# Patient Record
Sex: Female | Born: 1975
Health system: Southern US, Community
[De-identification: ages and names within clinical notes are randomized; demographics above are authoritative.]

## PROBLEM LIST (undated history)

## (undated) DIAGNOSIS — E785 Hyperlipidemia, unspecified: Secondary | ICD-10-CM

## (undated) DIAGNOSIS — N939 Abnormal uterine and vaginal bleeding, unspecified: Secondary | ICD-10-CM

## (undated) DIAGNOSIS — F419 Anxiety disorder, unspecified: Secondary | ICD-10-CM

## (undated) DIAGNOSIS — F329 Major depressive disorder, single episode, unspecified: Secondary | ICD-10-CM

## (undated) DIAGNOSIS — N83209 Unspecified ovarian cyst, unspecified side: Secondary | ICD-10-CM

## (undated) DIAGNOSIS — N946 Dysmenorrhea, unspecified: Secondary | ICD-10-CM

## (undated) DIAGNOSIS — N920 Excessive and frequent menstruation with regular cycle: Secondary | ICD-10-CM

## (undated) DIAGNOSIS — E559 Vitamin D deficiency, unspecified: Secondary | ICD-10-CM

## (undated) DIAGNOSIS — F32A Depression, unspecified: Secondary | ICD-10-CM

## (undated) DIAGNOSIS — K219 Gastro-esophageal reflux disease without esophagitis: Secondary | ICD-10-CM

## (undated) HISTORY — DX: Dysmenorrhea, unspecified: N94.6

## (undated) HISTORY — DX: Gastro-esophageal reflux disease without esophagitis: K21.9

## (undated) HISTORY — DX: Anxiety disorder, unspecified: F41.9

## (undated) HISTORY — DX: Hyperlipidemia, unspecified: E78.5

## (undated) HISTORY — DX: Unspecified ovarian cyst, unspecified side: N83.209

## (undated) HISTORY — PX: ESOPHAGOGASTRODUODENOSCOPY: SHX1529

## (undated) HISTORY — DX: Abnormal uterine and vaginal bleeding, unspecified: N93.9

## (undated) HISTORY — PX: TUBAL LIGATION: SHX77

## (undated) HISTORY — DX: Major depressive disorder, single episode, unspecified: F32.9

## (undated) HISTORY — DX: Vitamin D deficiency, unspecified: E55.9

## (undated) HISTORY — DX: Excessive and frequent menstruation with regular cycle: N92.0

## (undated) HISTORY — PX: ROTATOR CUFF REPAIR: SHX139

## (undated) HISTORY — DX: Depression, unspecified: F32.A

## (undated) HISTORY — PX: SHOULDER ARTHROSCOPY: SHX128

---

## 2006-04-16 ENCOUNTER — Ambulatory Visit: Payer: Self-pay

## 2006-06-18 ENCOUNTER — Ambulatory Visit: Payer: Self-pay | Admitting: Internal Medicine

## 2010-10-06 ENCOUNTER — Ambulatory Visit: Payer: Self-pay | Admitting: Family Medicine

## 2016-05-24 ENCOUNTER — Other Ambulatory Visit: Payer: Self-pay | Admitting: Obstetrics and Gynecology

## 2016-05-24 DIAGNOSIS — Z1231 Encounter for screening mammogram for malignant neoplasm of breast: Secondary | ICD-10-CM

## 2016-06-13 ENCOUNTER — Other Ambulatory Visit: Payer: Self-pay | Admitting: Obstetrics and Gynecology

## 2016-06-13 ENCOUNTER — Ambulatory Visit
Admission: RE | Admit: 2016-06-13 | Discharge: 2016-06-13 | Disposition: A | Discharge status: Home / Self Care | Payer: BLUE CROSS/BLUE SHIELD | Source: Ambulatory Visit | Attending: Obstetrics and Gynecology | Admitting: Obstetrics and Gynecology

## 2016-06-13 DIAGNOSIS — Z1231 Encounter for screening mammogram for malignant neoplasm of breast: Secondary | ICD-10-CM | POA: Diagnosis not present

## 2016-11-26 HISTORY — PX: COLONOSCOPY: SHX174

## 2017-05-22 ENCOUNTER — Ambulatory Visit (INDEPENDENT_AMBULATORY_CARE_PROVIDER_SITE_OTHER): Payer: BLUE CROSS/BLUE SHIELD | Admitting: Obstetrics and Gynecology

## 2017-05-22 ENCOUNTER — Encounter: Payer: Self-pay | Admitting: Obstetrics and Gynecology

## 2017-05-22 VITALS — BP 118/76 | Ht 64.0 in | Wt 174.0 lb

## 2017-05-22 DIAGNOSIS — Z01419 Encounter for gynecological examination (general) (routine) without abnormal findings: Secondary | ICD-10-CM | POA: Diagnosis not present

## 2017-05-22 DIAGNOSIS — Z1231 Encounter for screening mammogram for malignant neoplasm of breast: Secondary | ICD-10-CM

## 2017-05-22 DIAGNOSIS — N914 Secondary oligomenorrhea: Secondary | ICD-10-CM

## 2017-05-22 DIAGNOSIS — R634 Abnormal weight loss: Secondary | ICD-10-CM | POA: Diagnosis not present

## 2017-05-22 DIAGNOSIS — E559 Vitamin D deficiency, unspecified: Secondary | ICD-10-CM

## 2017-05-22 DIAGNOSIS — Z1239 Encounter for other screening for malignant neoplasm of breast: Secondary | ICD-10-CM

## 2017-05-22 NOTE — Progress Notes (Addendum)
Chief Complaint  Patient presents with  . Annual Exam     HPI:      [name] is a 41 y.o. [id number] who LMP was Patient's last menstrual period was 02/19/2017., presents today for her annual examination.  Her menses are usually every 28-30 days, lasting 3 days.  Dysmenorrhea mild, occurring premenstrually. She does not have intermenstrual bleeding. She has not had a period since 3/18 and this is unusual for her. She has lost 12# since last yr's annual, but closer to 25# per pt report (had gained wt since last seen by [place]). She states she doesn't have an appetite and has to force herself to eat. She denies any n/v/d/c sx. No pelvic pain.   Sex activity: single partner, contraception - tubal ligation.  Last Pap: January 31, 2015  Results were: no abnormalities /neg HPV DNA  Hx of STDs: none  Last mammogram: June 13, 2016  Results were: normal--routine follow-up in 12 months There is a FH of breast cancer in her MGM, genetic testing not indicated. There is no FH of ovarian cancer. The patient does not do self-breast exams.  Tobacco use: 2-3 cigs daily Alcohol use: none Exercise: moderately active  She does get adequate calcium and Vitamin D in her diet.  She had borderline lipids last yr and Vit D deficiency. She was taking Vit D supp but has since stopped them.   Pt stopped the prozac for anxiety and depression and feels fine without it. She has occas anxiety but manages it without meds.  Past Medical History:  Diagnosis Date  . Abnormal uterine bleeding   . Anxiety   . Depression   . Dysmenorrhea   . GERD (gastroesophageal reflux disease)   . Hyperlipidemia   . Menorrhagia   . Ovarian cyst   . Vitamin D deficiency     Past Surgical History:  Procedure Laterality Date  . COLONOSCOPY    . ESOPHAGOGASTRODUODENOSCOPY    . ROTATOR CUFF REPAIR    . SHOULDER ARTHROSCOPY    . TUBAL LIGATION      Family History  Problem Relation Age of Onset  . Breast cancer  Maternal Grandmother 50  . Diabetes Mother   . Hypertension Mother   . Diabetes Father     Social History   Social History  . Marital status: Single    Spouse name: N/A  . Number of children: N/A  . Years of education: N/A   Occupational History  . Not on file.   Social History Main Topics  . Smoking status: Current Some Day Smoker  . Smokeless tobacco: Never Used  . Alcohol use No  . Drug use: No  . Sexual activity: Yes    Birth control/ protection: Surgical   Other Topics Concern  . Not on file   Social History Narrative  . No narrative on file     Current Outpatient Prescriptions:  .  esomeprazole (NEXIUM) 40 MG capsule, Take 40 mg by mouth daily at 12 noon., Disp: , Rfl:   ROS:  Review of Systems  Constitutional: Positive for appetite change and unexpected weight change. Negative for fatigue and fever.  Respiratory: Negative for cough, shortness of breath and wheezing.   Cardiovascular: Negative for chest pain, palpitations and leg swelling.  Gastrointestinal: Negative for blood in stool, constipation, diarrhea, nausea and vomiting.  Endocrine: Negative for cold intolerance, heat intolerance and polyuria.  Genitourinary: Negative for dyspareunia, dysuria, flank pain, frequency, genital sores, hematuria, menstrual  problem, pelvic pain, urgency, vaginal bleeding, vaginal discharge and vaginal pain.  Musculoskeletal: Negative for back pain, joint swelling and myalgias.  Skin: Positive for rash.  Neurological: Negative for dizziness, syncope, light-headedness, numbness and headaches.  Hematological: Negative for adenopathy.  Psychiatric/Behavioral: Negative for agitation, confusion, sleep disturbance and suicidal ideas. The patient is nervous/anxious.      Objective: BP 118/76   Ht 5\' 4"  (1.626 m)   Wt 174 lb (78.9 kg)   LMP 02/19/2017   BMI 29.87 kg/m    Physical Exam  Constitutional: She is oriented to person, place, and time. She appears  well-developed and well-nourished.  Genitourinary: Vagina normal and uterus normal. There is no rash or tenderness on the right labia. There is no rash or tenderness on the left labia. No erythema or tenderness in the vagina. No vaginal discharge found. Right adnexum does not display mass and does not display tenderness. Left adnexum does not display mass and does not display tenderness. Cervix does not exhibit motion tenderness or polyp. Uterus is not enlarged or tender.  Neck: Normal range of motion. No thyromegaly present.  Cardiovascular: Normal rate, regular rhythm and normal heart sounds.   No murmur heard. Pulmonary/Chest: Effort normal and breath sounds normal. Right breast exhibits no mass, no nipple discharge, no skin change and no tenderness. Left breast exhibits no mass, no nipple discharge, no skin change and no tenderness.  Abdominal: Soft. There is no tenderness. There is no guarding.  Musculoskeletal: Normal range of motion.  Neurological: She is alert and oriented to person, place, and time. No cranial nerve deficit.  Psychiatric: She has a normal mood and affect. Her behavior is normal.  Vitals reviewed.    Assessment/Plan: Encounter for annual routine gynecological examination  Screening for breast cancer - Pt to sched mammo. - Plan: MM DIGITAL SCREENING BILATERAL  Secondary oligomenorrhea - Check labs. Will f/u with results. May be related to wt loss. - Plan: Comprehensive metabolic panel, TSH + free T4, Prolactin, Follicle stimulating hormone  Weight loss, unintentional - Check labs. If neg, refer to GI. - Plan: Comprehensive metabolic panel, TSH + free T4, CBC with Differential  Vitamin D deficiency - Restart Vit D3 5000 IU daily.             GYN counsel mammography screening, adequate intake of calcium and vitamin D, diet and exercise     F/U  Return in about 1 year (around 05/22/2018).  Beth B. Copland, PA-C 05/22/2017 9:38 AM

## 2017-05-23 ENCOUNTER — Telehealth: Payer: Self-pay | Admitting: Obstetrics and Gynecology

## 2017-05-23 DIAGNOSIS — R634 Abnormal weight loss: Secondary | ICD-10-CM

## 2017-05-23 LAB — COMPREHENSIVE METABOLIC PANEL
ALT: 27 IU/L (ref 0–32)
AST: 27 IU/L (ref 0–40)
Albumin/Globulin Ratio: 1.5 (ref 1.2–2.2)
Albumin: 4.3 g/dL (ref 3.5–5.5)
Alkaline Phosphatase: 74 IU/L (ref 39–117)
BUN/Creatinine Ratio: 11 (ref 9–23)
BUN: 8 mg/dL (ref 6–24)
Bilirubin Total: 0.4 mg/dL (ref 0.0–1.2)
CO2: 20 mmol/L (ref 20–29)
CREATININE: 0.7 mg/dL (ref 0.57–1.00)
Calcium: 9.4 mg/dL (ref 8.7–10.2)
Chloride: 104 mmol/L (ref 96–106)
GFR calc Af Amer: 124 mL/min/{1.73_m2} (ref >59)
GFR calc non Af Amer: 107 mL/min/{1.73_m2} (ref >59)
GLOBULIN, TOTAL: 2.9 g/dL (ref 1.5–4.5)
Glucose: 84 mg/dL (ref 65–99)
Potassium: 4.6 mmol/L (ref 3.5–5.2)
SODIUM: 140 mmol/L (ref 134–144)
Total Protein: 7.2 g/dL (ref 6.0–8.5)

## 2017-05-23 LAB — TSH+FREE T4
FREE T4: 1.16 ng/dL (ref 0.82–1.77)
TSH: 2.42 u[IU]/mL (ref 0.450–4.500)

## 2017-05-23 LAB — CBC WITH DIFFERENTIAL/PLATELET
BASOS: 1 %
Basophils Absolute: 0.1 10*3/uL (ref 0.0–0.2)
EOS (ABSOLUTE): 0.2 10*3/uL (ref 0.0–0.4)
EOS: 2 %
HEMATOCRIT: 42.7 % (ref 34.0–46.6)
Hemoglobin: 14.7 g/dL (ref 11.1–15.9)
Immature Grans (Abs): 0 10*3/uL (ref 0.0–0.1)
Immature Granulocytes: 0 %
LYMPHS ABS: 2.3 10*3/uL (ref 0.7–3.1)
Lymphs: 35 %
MCH: 29.2 pg (ref 26.6–33.0)
MCHC: 34.4 g/dL (ref 31.5–35.7)
MCV: 85 fL (ref 79–97)
MONOS ABS: 0.5 10*3/uL (ref 0.1–0.9)
Monocytes: 8 %
Neutrophils Absolute: 3.4 10*3/uL (ref 1.4–7.0)
Neutrophils: 54 %
Platelets: 389 10*3/uL — ABNORMAL HIGH (ref 150–379)
RBC: 5.04 x10E6/uL (ref 3.77–5.28)
RDW: 13.8 % (ref 12.3–15.4)
WBC: 6.4 10*3/uL (ref 3.4–10.8)

## 2017-05-23 LAB — FOLLICLE STIMULATING HORMONE: FSH: 8.7 m[IU]/mL

## 2017-05-23 LAB — PROLACTIN: Prolactin: 8.4 ng/mL (ref 4.8–23.3)

## 2017-05-23 MED ORDER — MEDROXYPROGESTERONE ACETATE 10 MG PO TABS: ORAL_TABLET | ORAL | 0 refills | Status: DC

## 2017-05-23 NOTE — Telephone Encounter (Signed)
Pt aware of neg labs for oligomenorrhea. Probably related to wt loss. Will try Rx provera. Pt is s/p TL and had neg UPT. Rx eRxd. F/u if no withdrawal bleed or if oligomenorrhea persists after withdrawal bleed.   Refer to GI due to anorexia sx and wt loss.

## 2017-06-19 ENCOUNTER — Ambulatory Visit
Admission: RE | Admit: 2017-06-19 | Discharge: 2017-06-19 | Disposition: A | Discharge status: Home / Self Care | Payer: BLUE CROSS/BLUE SHIELD | Source: Ambulatory Visit | Attending: Obstetrics and Gynecology | Admitting: Obstetrics and Gynecology

## 2017-06-19 ENCOUNTER — Encounter: Payer: Self-pay | Admitting: Obstetrics and Gynecology

## 2017-06-19 DIAGNOSIS — Z1239 Encounter for other screening for malignant neoplasm of breast: Secondary | ICD-10-CM

## 2017-06-19 DIAGNOSIS — Z1231 Encounter for screening mammogram for malignant neoplasm of breast: Secondary | ICD-10-CM | POA: Diagnosis present

## 2017-07-22 ENCOUNTER — Ambulatory Visit: Payer: BLUE CROSS/BLUE SHIELD | Admitting: Gastroenterology

## 2017-09-16 ENCOUNTER — Encounter: Payer: Self-pay | Admitting: Obstetrics and Gynecology

## 2017-09-16 NOTE — Telephone Encounter (Signed)
Can you pls change this ref? Pt seeing GI for unintentional wt loss. Thx.

## 2017-09-18 ENCOUNTER — Other Ambulatory Visit: Payer: Self-pay | Admitting: Obstetrics and Gynecology

## 2017-09-18 ENCOUNTER — Encounter: Payer: Self-pay | Admitting: Obstetrics and Gynecology

## 2017-09-18 MED ORDER — TERCONAZOLE 0.4 % VA CREA: 1.0000 | TOPICAL_CREAM | Freq: Every day | VAGINAL | 0 refills | Status: DC

## 2017-09-18 NOTE — Progress Notes (Signed)
Rx terazol for yeast vag sx, failed monistat. RTO prn sx persist.

## 2017-09-24 ENCOUNTER — Ambulatory Visit: Payer: BLUE CROSS/BLUE SHIELD | Admitting: Gastroenterology

## 2017-10-10 ENCOUNTER — Encounter: Payer: Self-pay | Admitting: Obstetrics and Gynecology

## 2017-10-16 ENCOUNTER — Encounter: Payer: Self-pay | Admitting: Obstetrics and Gynecology

## 2017-11-26 DIAGNOSIS — E28319 Asymptomatic premature menopause: Secondary | ICD-10-CM

## 2017-11-26 HISTORY — DX: Asymptomatic premature menopause: E28.319

## 2018-05-20 ENCOUNTER — Encounter: Payer: Self-pay | Admitting: Obstetrics and Gynecology

## 2018-06-02 ENCOUNTER — Ambulatory Visit (INDEPENDENT_AMBULATORY_CARE_PROVIDER_SITE_OTHER): Payer: BLUE CROSS/BLUE SHIELD | Admitting: Obstetrics and Gynecology

## 2018-06-02 ENCOUNTER — Encounter: Payer: Self-pay | Admitting: Obstetrics and Gynecology

## 2018-06-02 ENCOUNTER — Other Ambulatory Visit (HOSPITAL_COMMUNITY)
Admission: RE | Admit: 2018-06-02 | Discharge: 2018-06-02 | Disposition: A | Discharge status: Home / Self Care | Payer: BLUE CROSS/BLUE SHIELD | Source: Ambulatory Visit | Attending: Obstetrics and Gynecology | Admitting: Obstetrics and Gynecology

## 2018-06-02 VITALS — BP 118/78 | HR 66 | Ht 64.0 in | Wt 177.0 lb

## 2018-06-02 DIAGNOSIS — Z01419 Encounter for gynecological examination (general) (routine) without abnormal findings: Secondary | ICD-10-CM | POA: Diagnosis not present

## 2018-06-02 DIAGNOSIS — Z124 Encounter for screening for malignant neoplasm of cervix: Secondary | ICD-10-CM | POA: Diagnosis present

## 2018-06-02 DIAGNOSIS — Z1239 Encounter for other screening for malignant neoplasm of breast: Secondary | ICD-10-CM

## 2018-06-02 DIAGNOSIS — Z1151 Encounter for screening for human papillomavirus (HPV): Secondary | ICD-10-CM | POA: Diagnosis not present

## 2018-06-02 DIAGNOSIS — Z0000 Encounter for general adult medical examination without abnormal findings: Secondary | ICD-10-CM

## 2018-06-02 DIAGNOSIS — Z1231 Encounter for screening mammogram for malignant neoplasm of breast: Secondary | ICD-10-CM | POA: Diagnosis not present

## 2018-06-02 DIAGNOSIS — Z1321 Encounter for screening for nutritional disorder: Secondary | ICD-10-CM | POA: Diagnosis not present

## 2018-06-02 DIAGNOSIS — N926 Irregular menstruation, unspecified: Secondary | ICD-10-CM | POA: Diagnosis not present

## 2018-06-02 DIAGNOSIS — Z13220 Encounter for screening for lipoid disorders: Secondary | ICD-10-CM | POA: Diagnosis not present

## 2018-06-02 NOTE — Patient Instructions (Addendum)
I value your feedback and entrusting us with your care. If you get a [facility] patient survey, I would appreciate you taking the time to let us know about your experience today. Thank you!  [facility]: [phone]    

## 2018-06-02 NOTE — Progress Notes (Signed)
Chief Complaint  Patient presents with  . Gynecologic Exam    Irregular periods no period for 9 months then period twice in june 2019 heavy      HPI:      [name] is a 42 y.o. [id number] who LMP was Patient's last menstrual period was 05/17/2018 (exact date)., presents today for her annual examination.  She had not had a period in 9 months but then had 2 periods in June. The first one lasted 5 days, med to heavy flow, and then she bled again 2 wks after it ended, on June 22. Again, bleeding lasted 5 days, med to heavy flow. Pt has dysmen, improved with NSAIDs. She feels like she is going to start again due to breast tenderness bilat and heavy pelvic feeling. She had oligomenorrhea last yr with neg labs/eval and it was thought to be due to unexplained wt loss. Sx have persisted. No vasomotor sx.  Sex activity: not sex active; s/p tubal ligation.  Last Pap: January 31, 2015  Results were: no abnormalities /neg HPV DNA  Hx of STDs: none  Last mammogram: June 19, 2017  Results were: normal--routine follow-up in 12 months There is a FH of breast cancer in her MGM and PGM, genetic testing not indicated. There is no FH of ovarian cancer. The patient does not do self-breast exams.  Tobacco use: none; stopped cigs last yr Alcohol use: none Exercise: moderately active  She does get adequate calcium bu t not Vitamin D in her diet.  She has a hx Vit D deficiency. She was taking Vit D supp but has since stopped them for a couple yrs now. Hx of borderline lipids in the past, due for rechk.   Pt had eval for wt loss with GI with no known cause identified. Pt had neg upper endoscopy and had polyp on colonoscopy. Repeat due in 5 yrs. Wt has since stabilized. Pt up 3# since we saw her last yr.   Past Medical History:  Diagnosis Date  . Abnormal uterine bleeding   . Anxiety   . Depression   . Dysmenorrhea   . GERD (gastroesophageal reflux disease)   . Hyperlipidemia   .  Menorrhagia   . Ovarian cyst   . Vitamin D deficiency     Past Surgical History:  Procedure Laterality Date  . COLONOSCOPY  2018   polyp; repeat in 5 yrs  . ESOPHAGOGASTRODUODENOSCOPY    . ROTATOR CUFF REPAIR    . SHOULDER ARTHROSCOPY    . TUBAL LIGATION      Family History  Problem Relation Age of Onset  . Breast cancer Maternal Grandmother 49  . Diabetes Mother   . Hypertension Mother   . Diabetes Father   . Breast cancer Paternal Grandmother 56  . Brain cancer Paternal Grandmother 34    Social History   Socioeconomic History  . Marital status: Single    Spouse name: Not on file  . Number of children: Not on file  . Years of education: Not on file  . Highest education level: Not on file  Occupational History  . Not on file  Social Needs  . Financial resource strain: Not on file  . Food insecurity:    Worry: Not on file    Inability: Not on file  . Transportation needs:    Medical: Not on file    Non-medical: Not on file  Tobacco Use  . Smoking status: Current Some Day Smoker  .  Smokeless tobacco: Never Used  Substance and Sexual Activity  . Alcohol use: No  . Drug use: No  . Sexual activity: Yes    Birth control/protection: Surgical    Comment: Tubal ligation   Lifestyle  . Physical activity:    Days per week: Not on file    Minutes per session: Not on file  . Stress: Not on file  Relationships  . Social connections:    Talks on phone: Not on file    Gets together: Not on file    Attends religious service: Not on file    Active member of club or organization: Not on file    Attends meetings of clubs or organizations: Not on file    Relationship status: Not on file  . Intimate partner violence:    Fear of current or ex partner: Not on file    Emotionally abused: Not on file    Physically abused: Not on file    Forced sexual activity: Not on file  Other Topics Concern  . Not on file  Social History Narrative  . Not on file    Current  Outpatient Medications on File Prior to Visit  Medication Sig Dispense Refill  . esomeprazole (NEXIUM) 40 MG capsule Take 40 mg by mouth daily at 12 noon.    . medroxyPROGESTERone (PROVERA) 10 MG tablet Take 1 tablet daily for 7 days (Patient not taking: Reported on 06/02/2018) 7 tablet 0  . naproxen sodium (ALEVE) 220 MG tablet Take by mouth.    . terconazole (TERAZOL 7) 0.4 % vaginal cream Place 1 applicator vaginally at bedtime. (Patient not taking: Reported on 06/02/2018) 45 g 0   No current facility-administered medications on file prior to visit.       ROS:  Review of Systems  Constitutional: Positive for fatigue. Negative for fever and unexpected weight change.  Respiratory: Negative for cough, shortness of breath and wheezing.   Cardiovascular: Negative for chest pain, palpitations and leg swelling.  Gastrointestinal: Negative for blood in stool, constipation, diarrhea, nausea and vomiting.  Endocrine: Negative for cold intolerance, heat intolerance and polyuria.  Genitourinary: Negative for dyspareunia, dysuria, flank pain, frequency, genital sores, hematuria, menstrual problem, pelvic pain, urgency, vaginal bleeding, vaginal discharge and vaginal pain.  Musculoskeletal: Negative for back pain, joint swelling and myalgias.  Skin: Negative for rash.  Neurological: Positive for headaches. Negative for dizziness, syncope, light-headedness and numbness.  Hematological: Negative for adenopathy.  Psychiatric/Behavioral: Negative for agitation, confusion, sleep disturbance and suicidal ideas. The patient is not nervous/anxious.      Objective: BP 118/78   Pulse 66   Ht 5\' 4"  (1.626 m)   Wt 177 lb (80.3 kg)   LMP 05/17/2018 (Exact Date)   BMI 30.38 kg/m    Physical Exam  Constitutional: She is oriented to person, place, and time. She appears well-developed and well-nourished.  Genitourinary: Vagina normal. There is no rash or tenderness on the right labia. There is no rash or  tenderness on the left labia. No erythema or tenderness in the vagina. No vaginal discharge found. Right adnexum does not display mass and does not display tenderness. Left adnexum does not display mass and does not display tenderness. Cervix does not exhibit motion tenderness or polyp.   Uterus is tender. Uterus is not enlarged.  Neck: Normal range of motion. No thyromegaly present.  Cardiovascular: Normal rate, regular rhythm and normal heart sounds.  No murmur heard. Pulmonary/Chest: Effort normal and breath sounds normal. Right breast exhibits  no mass, no nipple discharge, no skin change and no tenderness. Left breast exhibits no mass, no nipple discharge, no skin change and no tenderness.  Abdominal: Soft. She exhibits no distension and no mass. There is tenderness in the suprapubic area. There is no guarding.  Musculoskeletal: Normal range of motion.  Neurological: She is alert and oriented to person, place, and time. No cranial nerve deficit.  Psychiatric: She has a normal mood and affect. Her behavior is normal.  Vitals reviewed.   Assessment/Plan: Encounter for annual routine gynecological examination  Cervical cancer screening - Plan: Cytology - PAP  Screening for HPV (human papillomavirus) - Plan: Cytology - PAP  Screening for breast cancer - Pt to sched mammo. - Plan: MM DIGITAL SCREENING BILATERAL  Blood tests for routine general physical examination - Plan: Comprehensive metabolic panel, Lipid panel, VITAMIN D 25 Hydroxy (Vit-D Deficiency, Fractures)  Screening cholesterol level - Plan: Lipid panel  Encounter for vitamin deficiency screening - Hx of deficiency in past and pt not taking supp. - Plan: VITAMIN D 25 Hydroxy (Vit-D Deficiency, Fractures)  Irregular menses - Amenorrhea and then 2 periods 6/19. Pt to call with next menses. If too soon, will check u/s day 5-9 of cycle.            GYN counsel breast self exam, mammography screening, menopause, adequate intake of  calcium and vitamin D, diet and exercise     F/U  Return in about 1 year (around 06/03/2019).  Marlean Mortell B. Brownie Nehme, PA-C 06/02/2018 11:33 AM

## 2018-06-03 LAB — LIPID PANEL
Chol/HDL Ratio: 4.2 ratio (ref 0.0–4.4)
Cholesterol, Total: 196 mg/dL (ref 100–199)
HDL: 47 mg/dL (ref >39)
LDL Calculated: 133 mg/dL — ABNORMAL HIGH (ref 0–99)
TRIGLYCERIDES: 78 mg/dL (ref 0–149)
VLDL Cholesterol Cal: 16 mg/dL (ref 5–40)

## 2018-06-03 LAB — COMPREHENSIVE METABOLIC PANEL
A/G RATIO: 1.4 (ref 1.2–2.2)
ALT: 29 IU/L (ref 0–32)
AST: 28 IU/L (ref 0–40)
Albumin: 4.4 g/dL (ref 3.5–5.5)
Alkaline Phosphatase: 73 IU/L (ref 39–117)
BILIRUBIN TOTAL: 0.5 mg/dL (ref 0.0–1.2)
BUN/Creatinine Ratio: 9 (ref 9–23)
BUN: 7 mg/dL (ref 6–24)
CALCIUM: 9.5 mg/dL (ref 8.7–10.2)
CHLORIDE: 102 mmol/L (ref 96–106)
CO2: 21 mmol/L (ref 20–29)
Creatinine, Ser: 0.8 mg/dL (ref 0.57–1.00)
GFR calc Af Amer: 104 mL/min/{1.73_m2} (ref >59)
GFR, EST NON AFRICAN AMERICAN: 91 mL/min/{1.73_m2} (ref >59)
Globulin, Total: 3.1 g/dL (ref 1.5–4.5)
Glucose: 89 mg/dL (ref 65–99)
POTASSIUM: 4.5 mmol/L (ref 3.5–5.2)
Sodium: 138 mmol/L (ref 134–144)
Total Protein: 7.5 g/dL (ref 6.0–8.5)

## 2018-06-03 LAB — CYTOLOGY - PAP: Diagnosis: NEGATIVE

## 2018-06-03 LAB — VITAMIN D 25 HYDROXY (VIT D DEFICIENCY, FRACTURES): VIT D 25 HYDROXY: 25.6 ng/mL — AB (ref 30.0–100.0)

## 2019-02-10 ENCOUNTER — Ambulatory Visit (INDEPENDENT_AMBULATORY_CARE_PROVIDER_SITE_OTHER): Payer: Managed Care, Other (non HMO) | Admitting: Obstetrics and Gynecology

## 2019-02-10 ENCOUNTER — Other Ambulatory Visit: Payer: Self-pay

## 2019-02-10 ENCOUNTER — Encounter: Payer: Self-pay | Admitting: Obstetrics and Gynecology

## 2019-02-10 VITALS — BP 100/74 | HR 76 | Ht 64.0 in | Wt 181.0 lb

## 2019-02-10 DIAGNOSIS — Z131 Encounter for screening for diabetes mellitus: Secondary | ICD-10-CM

## 2019-02-10 DIAGNOSIS — R232 Flushing: Secondary | ICD-10-CM | POA: Diagnosis not present

## 2019-02-10 DIAGNOSIS — R6889 Other general symptoms and signs: Secondary | ICD-10-CM | POA: Diagnosis not present

## 2019-02-10 DIAGNOSIS — E049 Nontoxic goiter, unspecified: Secondary | ICD-10-CM | POA: Diagnosis not present

## 2019-02-10 NOTE — Patient Instructions (Signed)
I value your feedback and entrusting us with your care. If you get a [facility] patient survey, I would appreciate you taking the time to let us know about your experience today. Thank you! 

## 2019-02-10 NOTE — Progress Notes (Signed)
Patient, No Pcp Per   Chief Complaint  Patient presents with  . Hot Flashes    twice in an hour, mainly head, cold and sweaty, tired x 1 week    HPI:      [name] is a 43 y.o. [phone] who LMP was No LMP recorded (lmp unknown). (Menstrual status: Irregular Periods)., presents today for acute onset of hot flashes, occurring about twice an hour all day, for the past wk. Sx last about 30 seconds, start at chest and move up head. Feels cold/sweaty/chilled/nauseated. No night sweats. Sx started spontaneously a wk ago. No URI sx although was sick about 2 wks ago with laryngitis. LMP 6/19 but no hx of past vasomotor sx. Pt also having trouble sleeping and feels fatigued. Pt notes her neck looks larger. No FH hypothyroidism, no recent thyroid labs.  Pt is seeing GI. Is on cymbalta for stomach issues and stopped cold [place] a few days ago for a different med, but that med (can't remember to name) caused her to feel disoriented, so she stopped it and restarted cymbalta a day or so ago. No other med changes.   Annual due 6/20.  Past Medical History:  Diagnosis Date  . Abnormal uterine bleeding   . Anxiety   . Depression   . Dysmenorrhea   . GERD (gastroesophageal reflux disease)   . Hyperlipidemia   . Menorrhagia   . Ovarian cyst   . Vitamin D deficiency     Past Surgical History:  Procedure Laterality Date  . COLONOSCOPY  2018   polyp; repeat in 5 yrs  . ESOPHAGOGASTRODUODENOSCOPY    . ROTATOR CUFF REPAIR    . SHOULDER ARTHROSCOPY    . TUBAL LIGATION      Family History  Problem Relation Age of Onset  . Breast cancer Maternal Grandmother 64  . Diabetes Mother   . Hypertension Mother   . Diabetes Father   . Breast cancer Paternal Grandmother 65  . Brain cancer Paternal Grandmother 64    Social History   Socioeconomic History  . Marital status: Single    Spouse name: Not on file  . Number of children: Not on file  . Years of education: Not on file  .  Highest education level: Not on file  Occupational History  . Not on file  Social Needs  . Financial resource strain: Not on file  . Food insecurity:    Worry: Not on file    Inability: Not on file  . Transportation needs:    Medical: Not on file    Non-medical: Not on file  Tobacco Use  . Smoking status: Current Some Day Smoker  . Smokeless tobacco: Never Used  Substance and Sexual Activity  . Alcohol use: No  . Drug use: No  . Sexual activity: Not Currently    Birth control/protection: Surgical    Comment: Tubal ligation   Lifestyle  . Physical activity:    Days per week: Not on file    Minutes per session: Not on file  . Stress: Not on file  Relationships  . Social connections:    Talks on phone: Not on file    Gets together: Not on file    Attends religious service: Not on file    Active member of club or organization: Not on file    Attends meetings of clubs or organizations: Not on file    Relationship status: Not on file  . Intimate partner violence:  Fear of current or ex partner: Not on file    Emotionally abused: Not on file    Physically abused: Not on file    Forced sexual activity: Not on file  Other Topics Concern  . Not on file  Social History Narrative  . Not on file    Outpatient Medications Prior to Visit  Medication Sig Dispense Refill  . DULoxetine (CYMBALTA) 20 MG capsule     . esomeprazole (NEXIUM) 40 MG capsule Take 40 mg by mouth daily at 12 noon.    . naproxen sodium (ALEVE) 220 MG tablet Take by mouth.    . desipramine (NORPRAMIN) 25 MG tablet Take by mouth.    . medroxyPROGESTERone (PROVERA) 10 MG tablet Take 1 tablet daily for 7 days (Patient not taking: Reported on 06/02/2018) 7 tablet 0  . terconazole (TERAZOL 7) 0.4 % vaginal cream Place 1 applicator vaginally at bedtime. (Patient not taking: Reported on 06/02/2018) 45 g 0   No facility-administered medications prior to visit.       ROS:  Review of Systems  Constitutional:  Positive for fatigue. Negative for fever and unexpected weight change.  Respiratory: Negative for cough, shortness of breath and wheezing.   Cardiovascular: Negative for chest pain, palpitations and leg swelling.  Gastrointestinal: Negative for blood in stool, constipation, diarrhea, nausea and vomiting.  Endocrine: Positive for cold intolerance and heat intolerance. Negative for polyuria.  Genitourinary: Negative for dyspareunia, dysuria, flank pain, frequency, genital sores, hematuria, menstrual problem, pelvic pain, urgency, vaginal bleeding, vaginal discharge and vaginal pain.  Musculoskeletal: Negative for back pain, joint swelling and myalgias.  Skin: Negative for rash.  Neurological: Negative for dizziness, syncope, light-headedness, numbness and headaches.  Hematological: Negative for adenopathy.  Psychiatric/Behavioral: Negative for agitation, confusion, sleep disturbance and suicidal ideas. The patient is not nervous/anxious.    BREAST: No symptoms   OBJECTIVE:   Vitals:  BP 100/74   Pulse 76   Ht 5\' 4"  (1.626 m)   Wt 181 lb (82.1 kg)   LMP  (LMP Unknown)   BMI 31.07 kg/m   Physical Exam Vitals signs reviewed.  Constitutional:      Appearance: She is well-developed.  Neck:     Musculoskeletal: Normal range of motion.     Thyroid: Thyromegaly present. No thyroid tenderness.  Pulmonary:     Effort: Pulmonary effort is normal.  Musculoskeletal: Normal range of motion.  Lymphadenopathy:     Cervical: No cervical adenopathy.  Neurological:     Mental Status: She is alert and oriented to person, place, and time.     Cranial Nerves: No cranial nerve deficit.  Psychiatric:        Behavior: Behavior normal.        Thought Content: Thought content normal.        Judgment: Judgment normal.     Assessment/Plan: Hot flashes - Check labs. Thyroid enlarged. Will f/u wiht results. Given sudden onset of sx, most likely not perimenopausal.   Heat intolerance - Plan: TSH +  free T4, Hemoglobin A1c  Enlarged thyroid - Check labs. If neg, check u/s.  - Plan: TSH + free T4  Screening for diabetes mellitus - Plan: Hemoglobin A1c    Return if symptoms worsen or fail to improve.  Sloane Palmer B. Kiaan Overholser, PA-C 02/10/2019 3:43 PM

## 2019-02-11 LAB — HEMOGLOBIN A1C
Est. average glucose Bld gHb Est-mCnc: 108 mg/dL
Hgb A1c MFr Bld: 5.4 % (ref 4.8–5.6)

## 2019-02-11 LAB — TSH+FREE T4
FREE T4: 1.03 ng/dL (ref 0.82–1.77)
TSH: 4 u[IU]/mL (ref 0.450–4.500)

## 2019-02-11 NOTE — Progress Notes (Signed)
Pls let pt know labs normal. Will sched thyroid u/s. Harriett Sine to call with appt info.

## 2019-02-11 NOTE — Progress Notes (Signed)
Pt aware, mentioned Harriett Sine has contacted her already.

## 2019-02-11 NOTE — Addendum Note (Signed)
Addended by: Althea Grimmer B on: 02/11/2019 08:17 AM   Modules accepted: Orders

## 2019-02-17 ENCOUNTER — Encounter: Payer: Self-pay | Admitting: Obstetrics and Gynecology

## 2019-02-18 ENCOUNTER — Telehealth: Payer: Self-pay | Admitting: Obstetrics and Gynecology

## 2019-02-18 DIAGNOSIS — R6889 Other general symptoms and signs: Secondary | ICD-10-CM

## 2019-02-18 DIAGNOSIS — R5383 Other fatigue: Secondary | ICD-10-CM

## 2019-02-18 MED ORDER — LEVOTHYROXINE SODIUM 25 MCG PO TABS: 12.5000 ug | ORAL_TABLET | Freq: Every day | ORAL | 0 refills | Status: DC

## 2019-02-18 NOTE — Telephone Encounter (Signed)
Pt had neg thyroid labs 3/20 and u/s for enlarged thyroid was "suspicious for sequelae of autoimmune thyroiditis" due to mild hyperemia. Size WNL/RT nodule too small to characterize.  Pt's vasomotor sx decreasing but still fatigued, heat intolerance, sleeping issues. Discussed watching sx vs trying low dose levothyroxine due to TSH=4.0 and some pts feel better closer to 1-2.0. Pt wants to try thyroid meds. Rx levo 12.5 mg daily, rechk labs in 4 wks. F/u prn.  Pt also stopped cymbalta for GI sx.  Recent Results (from the past 2160 hour(s))  TSH + free T4     Status: None   Collection Time: 02/10/19  3:34 PM  Result Value Ref Range   TSH 4.000 0.450 - 4.500 uIU/mL   Free T4 1.03 0.82 - 1.77 ng/dL  Hemoglobin K9X     Status: None   Collection Time: 02/10/19  3:34 PM  Result Value Ref Range   Hgb A1c MFr Bld 5.4 4.8 - 5.6 %    Comment:          Prediabetes: 5.7 - 6.4          Diabetes: >6.4          Glycemic control for adults with diabetes: <7.0    Est. average glucose Bld gHb Est-mCnc 108 mg/dL   Meds ordered this encounter  Medications  . levothyroxine (SYNTHROID, LEVOTHROID) 25 MCG tablet    Sig: Take 0.5 tablets (12.5 mcg total) by mouth daily before breakfast.    Dispense:  30 tablet    Refill:  0    Order Specific Question:   Supervising Provider    Answer:   Nadara Mustard [[id number]]

## 2019-02-19 ENCOUNTER — Ambulatory Visit: Payer: BLUE CROSS/BLUE SHIELD

## 2019-03-07 ENCOUNTER — Encounter: Payer: Self-pay | Admitting: Obstetrics and Gynecology

## 2019-03-12 ENCOUNTER — Other Ambulatory Visit: Payer: Self-pay | Admitting: Obstetrics and Gynecology

## 2019-03-12 DIAGNOSIS — R6889 Other general symptoms and signs: Secondary | ICD-10-CM

## 2019-03-12 DIAGNOSIS — R5383 Other fatigue: Secondary | ICD-10-CM

## 2019-03-12 NOTE — Telephone Encounter (Signed)
Please advise 

## 2019-03-23 ENCOUNTER — Other Ambulatory Visit: Payer: Managed Care, Other (non HMO)

## 2019-03-23 ENCOUNTER — Other Ambulatory Visit: Payer: Self-pay

## 2019-03-23 DIAGNOSIS — R6889 Other general symptoms and signs: Secondary | ICD-10-CM

## 2019-03-23 DIAGNOSIS — R5383 Other fatigue: Secondary | ICD-10-CM

## 2019-03-24 ENCOUNTER — Telehealth: Payer: Self-pay | Admitting: Obstetrics and Gynecology

## 2019-03-24 DIAGNOSIS — E049 Nontoxic goiter, unspecified: Secondary | ICD-10-CM

## 2019-03-24 DIAGNOSIS — R5383 Other fatigue: Secondary | ICD-10-CM

## 2019-03-24 DIAGNOSIS — R6889 Other general symptoms and signs: Secondary | ICD-10-CM

## 2019-03-24 LAB — TSH+FREE T4
Free T4: 0.96 ng/dL (ref 0.82–1.77)
TSH: 4.17 u[IU]/mL (ref 0.450–4.500)

## 2019-03-24 MED ORDER — LEVOTHYROXINE SODIUM 25 MCG PO TABS: 25.0000 ug | ORAL_TABLET | Freq: Every day | ORAL | 0 refills | Status: DC

## 2019-03-24 NOTE — Telephone Encounter (Signed)
Pt aware of slightly increased TSH of 4.170 (up from 4.0 last month), normal free T4. Pt's hot flashes improving, but sx mostly night sweats now. Pt had started levo 12.5 mg daily last month to see if she would feel better with TSH closer to 1.0-2.0. Interestingly, TSH increased slightly after 1 mo. Question coincidence or developing hypothyroidism that we caught early. 3/20 exam with enlarged thyroid and thyroid u/s showed possible sequelae of autoimmune thyroiditis.  Will increase levo to 25 mg daily and rechk labs in 4 wks, also check TPO. Rx eRxd.

## 2019-04-18 ENCOUNTER — Other Ambulatory Visit: Payer: Self-pay | Admitting: Obstetrics and Gynecology

## 2019-04-18 DIAGNOSIS — R5383 Other fatigue: Secondary | ICD-10-CM

## 2019-04-18 DIAGNOSIS — R6889 Other general symptoms and signs: Secondary | ICD-10-CM

## 2019-04-23 ENCOUNTER — Other Ambulatory Visit: Payer: Self-pay | Admitting: Obstetrics and Gynecology

## 2019-04-23 DIAGNOSIS — Z1231 Encounter for screening mammogram for malignant neoplasm of breast: Secondary | ICD-10-CM

## 2019-04-24 ENCOUNTER — Other Ambulatory Visit: Payer: Managed Care, Other (non HMO)

## 2019-04-24 ENCOUNTER — Other Ambulatory Visit: Payer: Self-pay

## 2019-04-24 DIAGNOSIS — E049 Nontoxic goiter, unspecified: Secondary | ICD-10-CM

## 2019-04-24 DIAGNOSIS — R6889 Other general symptoms and signs: Secondary | ICD-10-CM

## 2019-04-24 DIAGNOSIS — R5383 Other fatigue: Secondary | ICD-10-CM

## 2019-04-25 LAB — TSH+FREE T4
Free T4: 1.04 ng/dL (ref 0.82–1.77)
TSH: 2.88 u[IU]/mL (ref 0.450–4.500)

## 2019-04-25 LAB — THYROID PEROXIDASE ANTIBODY: Thyroperoxidase Ab SerPl-aCnc: >600 IU/mL — ABNORMAL HIGH (ref 0–34)

## 2019-04-27 ENCOUNTER — Other Ambulatory Visit: Payer: Self-pay | Admitting: Obstetrics and Gynecology

## 2019-04-27 DIAGNOSIS — R6889 Other general symptoms and signs: Secondary | ICD-10-CM

## 2019-04-27 DIAGNOSIS — R5383 Other fatigue: Secondary | ICD-10-CM

## 2019-04-27 MED ORDER — LEVOTHYROXINE SODIUM 25 MCG PO TABS: 25.0000 ug | ORAL_TABLET | Freq: Every day | ORAL | 0 refills | Status: DC

## 2019-04-27 NOTE — Progress Notes (Signed)
Pt aware of normal TSH and free T4 on levo 25 mcg daily. Has TPO antibodies. Has enlarged thyroid with suspicion for autoimmune thyroiditis on 3/20 thyroid u/s. Pt's vasomotor sx improving but still occur. Pt also with joint pain and swelling now. Suggested she f/u with PCP for further eval.  Rx RF levo for 3 months. Has annual 7/20.

## 2019-06-05 ENCOUNTER — Other Ambulatory Visit: Payer: Self-pay

## 2019-06-05 ENCOUNTER — Ambulatory Visit
Admission: RE | Admit: 2019-06-05 | Discharge: 2019-06-05 | Disposition: A | Discharge status: Home / Self Care | Payer: Managed Care, Other (non HMO) | Source: Ambulatory Visit | Attending: Obstetrics and Gynecology | Admitting: Obstetrics and Gynecology

## 2019-06-05 DIAGNOSIS — Z1231 Encounter for screening mammogram for malignant neoplasm of breast: Secondary | ICD-10-CM | POA: Diagnosis present

## 2019-06-08 ENCOUNTER — Other Ambulatory Visit: Payer: Self-pay | Admitting: Obstetrics and Gynecology

## 2019-06-08 DIAGNOSIS — R928 Other abnormal and inconclusive findings on diagnostic imaging of breast: Secondary | ICD-10-CM

## 2019-06-08 DIAGNOSIS — N6489 Other specified disorders of breast: Secondary | ICD-10-CM

## 2019-06-09 NOTE — Progress Notes (Addendum)
Chief Complaint  Patient presents with  . Gynecologic Exam     HPI:      Beth Ford is a 43 y.o. [id number] who LMP was Patient's last menstrual period was 05/17/2018 (exact date)., presents today for her annual examination. Her menses are absent since 04/2018. No BTB. Pt developed severe vasomotor sx 3/20 as well as fatigue, heat intolerance. She had high normal TSH 3/20 and enlarged thyroid on exam. Had micronodularity on thyroid u/s and positive thyroid ab.  Pt started on levo 25 mcg daily due to enlarged thyoid, hot flashes. Vasomotor sx improved but still sx. Still complains of fatigue. Getting about 6 hrs sleep nightly due to mind won't turn off. Has trouble falling and staying asleep. Tried melatonin (? Dose) but that makes her too tired when time to get up, although she does sleep much better with it.  Pt also complains of chronic sore throat for several months. Has enlarged thyroid and she feels like it's getting bigger and causing throat pain. Pt is euthyroid and had thyroid u/s 4/20. RT thyroid was 5x1.8x1.6 cm, LT thyroid was 4.8x1.7x1.5 cm, isthmus was 0.5 cm. No other allergy/resp sx.   Had normal CBC, CMP (except mildly elevated LFTs), HgA1C with PCP 6/20 and 7/20.   Diagnosed with tick-borne illness due to joint pain 6/20 with PCP and treated with abx for 2 wks. Sx improved.  Sex activity: not sex active; s/p tubal ligation.  Last Pap: 06/02/18  Results were: no abnormalities /neg HPV DNA 2016   Hx of STDs: none  Last mammogram: June 05, 2019  Results were: Cat 0 LT breast asymmetry--due for addl imaging.  There is a FH of breast cancer in her MGM and PGM, genetic testing not indicated. There is no FH of ovarian cancer. The patient does not do self-breast exams.  Tobacco use: none; stopped cigs 2018 Alcohol use: none Exercise: moderately active  She does get adequate calcium bu t not Vitamin D in her diet.  She has a hx Vit D deficiency. Not taking Vit D  supp. Hx of borderline lipids with low HDL and borderline LDL 2019. Due for labs.  Colonoscopy for wt loss 2018. Repeat due in 5 yrs.    Past Medical History:  Diagnosis Date  . Abnormal uterine bleeding   . Anxiety   . Depression   . Dysmenorrhea   . GERD (gastroesophageal reflux disease)   . Hyperlipidemia   . Menorrhagia   . Ovarian cyst   . Vitamin D deficiency     Past Surgical History:  Procedure Laterality Date  . COLONOSCOPY  2018   polyp; repeat in 5 yrs  . ESOPHAGOGASTRODUODENOSCOPY    . ROTATOR CUFF REPAIR    . SHOULDER ARTHROSCOPY    . TUBAL LIGATION      Family History  Problem Relation Age of Onset  . Breast cancer Maternal Grandmother [id number]  . Diabetes Mother   . Hypertension Mother   . Diabetes Father   . Breast cancer Paternal Grandmother [phone]  . Brain cancer Paternal Grandmother [id number]    Social History   Socioeconomic History  . Marital status: Single    Spouse name: Not on file  . Number of children: Not on file  . Years of education: Not on file  . Highest education level: Not on file  Occupational History  . Not on file  Social Needs  . Financial resource strain: Not on file  . Food insecurity  Worry: Not on file    Inability: Not on file  . Transportation needs    Medical: Not on file    Non-medical: Not on file  Tobacco Use  . Smoking status: Current Some Day Smoker  . Smokeless tobacco: Never Used  Substance and Sexual Activity  . Alcohol use: No  . Drug use: No  . Sexual activity: Not Currently    Birth control/protection: Surgical    Comment: Tubal ligation   Lifestyle  . Physical activity    Days per week: Not on file    Minutes per session: Not on file  . Stress: Not on file  Relationships  . Social Herbalist on phone: Not on file    Gets together: Not on file    Attends religious service: Not on file    Active member of club or organization: Not on file    Attends meetings of clubs or organizations: Not on  file    Relationship status: Not on file  . Intimate partner violence    Fear of current or ex partner: Not on file    Emotionally abused: Not on file    Physically abused: Not on file    Forced sexual activity: Not on file  Other Topics Concern  . Not on file  Social History Narrative  . Not on file    Current Outpatient Medications on File Prior to Visit  Medication Sig Dispense Refill  . esomeprazole (NEXIUM) 40 MG capsule Take 40 mg by mouth daily at 12 noon.    Marland Kitchen levothyroxine (SYNTHROID) 25 MCG tablet Take 1 tablet (25 mcg total) by mouth daily before breakfast. 90 tablet 0  . meloxicam (MOBIC) 7.5 MG tablet 1 TABLET BY MOUTH EVERY DAY   TWICE A DAY AS NEEDED WITH FOOD    . naproxen sodium (ALEVE) 220 MG tablet Take by mouth.    . SUMAtriptan (IMITREX) 50 MG tablet TAKE ONE AT THE ONSET OF HA, MAY TAKE SECOND PILL IF NO RELIEF     No current facility-administered medications on file prior to visit.       ROS:  Review of Systems  Constitutional: Positive for fatigue. Negative for fever and unexpected weight change.  Respiratory: Negative for cough, shortness of breath and wheezing.   Cardiovascular: Negative for chest pain, palpitations and leg swelling.  Gastrointestinal: Negative for blood in stool, constipation, diarrhea, nausea and vomiting.  Endocrine: Negative for cold intolerance, heat intolerance and polyuria.  Genitourinary: Negative for dyspareunia, dysuria, flank pain, frequency, genital sores, hematuria, menstrual problem, pelvic pain, urgency, vaginal bleeding, vaginal discharge and vaginal pain.  Musculoskeletal: Positive for arthralgias. Negative for back pain, joint swelling and myalgias.  Skin: Negative for rash.  Neurological: Negative for dizziness, syncope, light-headedness, numbness and headaches.  Hematological: Negative for adenopathy.  Psychiatric/Behavioral: Negative for agitation, confusion, sleep disturbance and suicidal ideas. The patient is not  nervous/anxious.      Objective: BP 110/70   Ht 5\' 4"  (1.626 m)   Wt 185 lb (83.9 kg)   LMP 05/17/2018 (Exact Date)   BMI 31.76 kg/m    Physical Exam Constitutional:      Appearance: She is well-developed.  Genitourinary:     Vulva, vagina, right adnexa and left adnexa normal.     No vulval lesion or tenderness noted.     No vaginal discharge, erythema or tenderness.     No cervical motion tenderness or polyp.     Uterus is tender.  Uterus is not enlarged.     No right or left adnexal mass present.     Right adnexa not tender.     Left adnexa not tender.  Neck:     Musculoskeletal: Normal range of motion.     Thyroid: No thyromegaly.  Cardiovascular:     Rate and Rhythm: Normal rate and regular rhythm.     Heart sounds: Normal heart sounds. No murmur.  Pulmonary:     Effort: Pulmonary effort is normal.     Breath sounds: Normal breath sounds.  Chest:     Breasts:        Right: No mass, nipple discharge, skin change or tenderness.        Left: No mass, nipple discharge, skin change or tenderness.  Abdominal:     General: There is no distension.     Palpations: Abdomen is soft. There is no mass.     Tenderness: There is no abdominal tenderness. There is no guarding.  Musculoskeletal: Normal range of motion.  Neurological:     General: No focal deficit present.     Mental Status: She is alert and oriented to person, place, and time.     Cranial Nerves: No cranial nerve deficit.  Skin:    General: Skin is warm and dry.  Psychiatric:        Mood and Affect: Mood normal.        Behavior: Behavior normal.        Thought Content: Thought content normal.        Judgment: Judgment normal.  Vitals signs reviewed.     Assessment/Plan: Encounter for annual routine gynecological examination -   Screening for breast cancer - Plan: [facility].   Amenorrhea - Plan: Follicle stimulating hormone, Estradiol, Check for premature menopause. If pos, can  try adding prog HRT given sleep issues, cont vasomotor sx.  Enlarged thyroid - Plan: TSH + free T4, On levo. Check levels. Will f/u with results.  Blood tests for routine general physical examination - Plan: Lipid panel, TSH + free T4, CANCELED: Comprehensive metabolic panel,   Screening cholesterol level - Plan: Lipid panel,   Hot flashes - Plan: Slightly improved on levo. May need HRT. Will f/u.            GYN counsel breast self exam, mammography screening, menopause, adequate intake of calcium and vitamin D, diet and exercise     F/U  Return in about 1 year (around 06/09/2020).   B. , PA-C 06/10/2019 1:57 PM

## 2019-06-10 ENCOUNTER — Encounter: Payer: Self-pay | Admitting: Obstetrics and Gynecology

## 2019-06-10 ENCOUNTER — Other Ambulatory Visit: Payer: Self-pay

## 2019-06-10 ENCOUNTER — Ambulatory Visit (INDEPENDENT_AMBULATORY_CARE_PROVIDER_SITE_OTHER): Payer: Managed Care, Other (non HMO) | Admitting: Obstetrics and Gynecology

## 2019-06-10 VITALS — BP 110/70 | Ht 64.0 in | Wt 185.0 lb

## 2019-06-10 DIAGNOSIS — N912 Amenorrhea, unspecified: Secondary | ICD-10-CM

## 2019-06-10 DIAGNOSIS — Z0000 Encounter for general adult medical examination without abnormal findings: Secondary | ICD-10-CM

## 2019-06-10 DIAGNOSIS — Z13220 Encounter for screening for lipoid disorders: Secondary | ICD-10-CM

## 2019-06-10 DIAGNOSIS — Z01419 Encounter for gynecological examination (general) (routine) without abnormal findings: Secondary | ICD-10-CM | POA: Diagnosis not present

## 2019-06-10 DIAGNOSIS — E049 Nontoxic goiter, unspecified: Secondary | ICD-10-CM

## 2019-06-10 DIAGNOSIS — R232 Flushing: Secondary | ICD-10-CM

## 2019-06-10 DIAGNOSIS — Z1239 Encounter for other screening for malignant neoplasm of breast: Secondary | ICD-10-CM

## 2019-06-10 NOTE — Patient Instructions (Signed)
I value your feedback and entrusting us with your care. If you get a [facility] patient survey, I would appreciate you taking the time to let us know about your experience today. Thank you! 

## 2019-06-11 ENCOUNTER — Telehealth: Payer: Self-pay | Admitting: Obstetrics and Gynecology

## 2019-06-11 DIAGNOSIS — E049 Nontoxic goiter, unspecified: Secondary | ICD-10-CM

## 2019-06-11 DIAGNOSIS — N951 Menopausal and female climacteric states: Secondary | ICD-10-CM

## 2019-06-11 DIAGNOSIS — R5383 Other fatigue: Secondary | ICD-10-CM

## 2019-06-11 DIAGNOSIS — R070 Pain in throat: Secondary | ICD-10-CM

## 2019-06-11 DIAGNOSIS — R6889 Other general symptoms and signs: Secondary | ICD-10-CM

## 2019-06-11 LAB — FOLLICLE STIMULATING HORMONE: FSH: 77.5 m[IU]/mL

## 2019-06-11 LAB — LIPID PANEL
Chol/HDL Ratio: 4.7 ratio — ABNORMAL HIGH (ref 0.0–4.4)
Cholesterol, Total: 201 mg/dL — ABNORMAL HIGH (ref 100–199)
HDL: 43 mg/dL (ref >39)
LDL Calculated: 138 mg/dL — ABNORMAL HIGH (ref 0–99)
Triglycerides: 102 mg/dL (ref 0–149)
VLDL Cholesterol Cal: 20 mg/dL (ref 5–40)

## 2019-06-11 LAB — TSH+FREE T4
Free T4: 0.99 ng/dL (ref 0.82–1.77)
TSH: 2.69 u[IU]/mL (ref 0.450–4.500)

## 2019-06-11 LAB — ESTRADIOL: Estradiol: 18.1 pg/mL

## 2019-06-11 MED ORDER — LEVOTHYROXINE SODIUM 25 MCG PO TABS: 25.0000 ug | ORAL_TABLET | Freq: Every day | ORAL | 1 refills | Status: DC

## 2019-06-11 MED ORDER — ESTRADIOL-NORETHINDRONE ACET 1-0.5 MG PO TABS: 1.0000 | ORAL_TABLET | Freq: Every day | ORAL | 0 refills | Status: DC

## 2019-06-11 NOTE — Telephone Encounter (Signed)
Pt aware of labs. Pt with autoimmune thyroiditis/enlarged thyroid and sx. On levo 25 mcg daily with normal TSH/free T4 levels. Cont dose. Rx eRxd. Rechk in 6 months. Pt cont to have throat pain and is concerned due to size of thyroid. Feels it has gotten larger--last u/s 3/20. Will refer to ENT to rule out any other throat pain etiology.   Pt also postmenopausal/early menopause on labs/amenorrheic for 1 yr. Discussed adding HRT for vasomotor/sleep sx. Rx activella. Pt to f/u via phone in 3 months re: sx.   Meds ordered this encounter  Medications  . estradiol-norethindrone (ACTIVELLA) 1-0.5 MG tablet    Sig: Take 1 tablet by mouth daily.    Dispense:  90 tablet    Refill:  0    Order Specific Question:   Supervising Provider    Answer:   Gae Dry [id number]  . levothyroxine (SYNTHROID) 25 MCG tablet    Sig: Take 1 tablet (25 mcg total) by mouth daily before breakfast.    Dispense:  90 tablet    Refill:  1    Order Specific Question:   Supervising Provider    Answer:   Gae Dry [phone]

## 2019-06-15 ENCOUNTER — Ambulatory Visit
Admission: RE | Admit: 2019-06-15 | Discharge: 2019-06-15 | Disposition: A | Discharge status: Home / Self Care | Payer: Managed Care, Other (non HMO) | Source: Ambulatory Visit | Attending: Obstetrics and Gynecology | Admitting: Obstetrics and Gynecology

## 2019-06-15 DIAGNOSIS — R928 Other abnormal and inconclusive findings on diagnostic imaging of breast: Secondary | ICD-10-CM

## 2019-06-15 DIAGNOSIS — N6489 Other specified disorders of breast: Secondary | ICD-10-CM

## 2019-06-16 ENCOUNTER — Encounter: Payer: Self-pay | Admitting: Obstetrics and Gynecology

## 2019-11-16 ENCOUNTER — Other Ambulatory Visit: Payer: Self-pay | Admitting: Obstetrics and Gynecology

## 2019-11-16 DIAGNOSIS — R6889 Other general symptoms and signs: Secondary | ICD-10-CM

## 2019-11-16 DIAGNOSIS — R5383 Other fatigue: Secondary | ICD-10-CM

## 2019-11-16 NOTE — Telephone Encounter (Signed)
Please advise 

## 2019-11-28 ENCOUNTER — Other Ambulatory Visit: Payer: Self-pay | Admitting: Obstetrics and Gynecology

## 2019-11-28 DIAGNOSIS — R6889 Other general symptoms and signs: Secondary | ICD-10-CM

## 2019-11-28 DIAGNOSIS — R5383 Other fatigue: Secondary | ICD-10-CM

## 2020-01-17 NOTE — Progress Notes (Deleted)
Tester, Hillary A, PA-C   No chief complaint on file.   HPI:      [name] is a 44 y.o. [phone] who LMP was No LMP recorded. (Menstrual status: Irregular Periods)., presents today for ***RLQ pain    Past Medical History:  Diagnosis Date  . Abnormal uterine bleeding   . Anxiety   . Depression   . Dysmenorrhea   . GERD (gastroesophageal reflux disease)   . Hyperlipidemia   . Menorrhagia   . Ovarian cyst   . Vitamin D deficiency     Past Surgical History:  Procedure Laterality Date  . COLONOSCOPY  2018   polyp; repeat in 5 yrs  . ESOPHAGOGASTRODUODENOSCOPY    . ROTATOR CUFF REPAIR    . SHOULDER ARTHROSCOPY    . TUBAL LIGATION      Family History  Problem Relation Age of Onset  . Breast cancer Maternal Grandmother 3  . Diabetes Mother   . Hypertension Mother   . Diabetes Father   . Breast cancer Paternal Grandmother 38  . Brain cancer Paternal Grandmother 52    Social History   Socioeconomic History  . Marital status: Single    Spouse name: Not on file  . Number of children: Not on file  . Years of education: Not on file  . Highest education level: Not on file  Occupational History  . Not on file  Tobacco Use  . Smoking status: Current Some Day Smoker  . Smokeless tobacco: Never Used  Substance and Sexual Activity  . Alcohol use: No  . Drug use: No  . Sexual activity: Not Currently    Birth control/protection: Surgical    Comment: Tubal ligation   Other Topics Concern  . Not on file  Social History Narrative  . Not on file   Social Determinants of Health   Financial Resource Strain:   . Difficulty of Paying Living Expenses: Not on file  Food Insecurity:   . Worried About Programme researcher, broadcasting/film/video in the Last Year: Not on file  . Ran Out of Food in the Last Year: Not on file  Transportation Needs:   . Lack of Transportation (Medical): Not on file  . Lack of Transportation (Non-Medical): Not on file  Physical Activity:   . Days  of Exercise per Week: Not on file  . Minutes of Exercise per Session: Not on file  Stress:   . Feeling of Stress : Not on file  Social Connections:   . Frequency of Communication with Friends and Family: Not on file  . Frequency of Social Gatherings with Friends and Family: Not on file  . Attends Religious Services: Not on file  . Active Member of Clubs or Organizations: Not on file  . Attends Banker Meetings: Not on file  . Marital Status: Not on file  Intimate Partner Violence:   . Fear of Current or Ex-Partner: Not on file  . Emotionally Abused: Not on file  . Physically Abused: Not on file  . Sexually Abused: Not on file    Outpatient Medications Prior to Visit  Medication Sig Dispense Refill  . esomeprazole (NEXIUM) 40 MG capsule Take 40 mg by mouth daily at 12 noon.    Marland Kitchen estradiol-norethindrone (ACTIVELLA) 1-0.5 MG tablet Take 1 tablet by mouth daily. 90 tablet 0  . levothyroxine (SYNTHROID) 25 MCG tablet Take 1 tablet (25 mcg total) by mouth daily before breakfast. 90 tablet 1  . meloxicam (MOBIC)  7.5 MG tablet 1 TABLET BY MOUTH EVERY DAY   TWICE A DAY AS NEEDED WITH FOOD    . naproxen sodium (ALEVE) 220 MG tablet Take by mouth.    . SUMAtriptan (IMITREX) 50 MG tablet TAKE ONE AT THE ONSET OF HA, MAY TAKE SECOND PILL IF NO RELIEF     No facility-administered medications prior to visit.      ROS:  Review of Systems BREAST: No symptoms   OBJECTIVE:   Vitals:  There were no vitals taken for this visit.  Physical Exam  Results: No results found for this or any previous visit (from the past 24 hour(s)).   Assessment/Plan: No diagnosis found.    No orders of the defined types were placed in this encounter.     No follow-ups on file.  Sota Hetz B. Shawntelle Ungar, PA-C 01/17/2020 6:19 PM

## 2020-01-18 ENCOUNTER — Ambulatory Visit: Payer: Managed Care, Other (non HMO) | Admitting: Obstetrics and Gynecology

## 2020-01-20 ENCOUNTER — Ambulatory Visit: Payer: Managed Care, Other (non HMO) | Admitting: Obstetrics and Gynecology

## 2020-01-24 ENCOUNTER — Other Ambulatory Visit: Payer: Self-pay | Admitting: Obstetrics and Gynecology

## 2020-01-24 DIAGNOSIS — R5383 Other fatigue: Secondary | ICD-10-CM

## 2020-01-24 DIAGNOSIS — R6889 Other general symptoms and signs: Secondary | ICD-10-CM

## 2020-06-15 ENCOUNTER — Ambulatory Visit (INDEPENDENT_AMBULATORY_CARE_PROVIDER_SITE_OTHER): Payer: Managed Care, Other (non HMO) | Admitting: Obstetrics and Gynecology

## 2020-06-15 ENCOUNTER — Encounter: Payer: Self-pay | Admitting: Obstetrics and Gynecology

## 2020-06-15 ENCOUNTER — Other Ambulatory Visit (HOSPITAL_COMMUNITY)
Admission: RE | Admit: 2020-06-15 | Discharge: 2020-06-15 | Disposition: A | Discharge status: Home / Self Care | Payer: Managed Care, Other (non HMO) | Source: Ambulatory Visit | Attending: Obstetrics and Gynecology | Admitting: Obstetrics and Gynecology

## 2020-06-15 ENCOUNTER — Other Ambulatory Visit: Payer: Self-pay

## 2020-06-15 VITALS — BP 100/70 | Ht 64.0 in | Wt 182.0 lb

## 2020-06-15 DIAGNOSIS — Z1151 Encounter for screening for human papillomavirus (HPV): Secondary | ICD-10-CM | POA: Insufficient documentation

## 2020-06-15 DIAGNOSIS — Z1231 Encounter for screening mammogram for malignant neoplasm of breast: Secondary | ICD-10-CM

## 2020-06-15 DIAGNOSIS — Z01419 Encounter for gynecological examination (general) (routine) without abnormal findings: Secondary | ICD-10-CM

## 2020-06-15 DIAGNOSIS — Z131 Encounter for screening for diabetes mellitus: Secondary | ICD-10-CM

## 2020-06-15 DIAGNOSIS — N951 Menopausal and female climacteric states: Secondary | ICD-10-CM | POA: Insufficient documentation

## 2020-06-15 DIAGNOSIS — Z0000 Encounter for general adult medical examination without abnormal findings: Secondary | ICD-10-CM

## 2020-06-15 DIAGNOSIS — E049 Nontoxic goiter, unspecified: Secondary | ICD-10-CM

## 2020-06-15 DIAGNOSIS — Z1329 Encounter for screening for other suspected endocrine disorder: Secondary | ICD-10-CM

## 2020-06-15 DIAGNOSIS — Z124 Encounter for screening for malignant neoplasm of cervix: Secondary | ICD-10-CM

## 2020-06-15 DIAGNOSIS — R5383 Other fatigue: Secondary | ICD-10-CM

## 2020-06-15 DIAGNOSIS — Z13220 Encounter for screening for lipoid disorders: Secondary | ICD-10-CM

## 2020-06-15 NOTE — Patient Instructions (Addendum)
I value your feedback and entrusting us with your care. If you get a [facility] patient survey, I would appreciate you taking the time to let us know about your experience today. Thank you!  As of November 05, 2019, your lab results will be released to your MyChart immediately, before I even have a chance to see them. Please give me time to review them and contact you if there are any abnormalities. Thank you for your patience.   [facility]: [phone]  [facility]: [phone]  

## 2020-06-15 NOTE — Progress Notes (Signed)
Chief Complaint  Patient presents with  . Gynecologic Exam     HPI:      Beth Ford is a 44 y.o. [phone] who LMP was No LMP recorded. (Menstrual status: Irregular Periods)., presents today for her annual examination. Her menses are absent since 04/2018. No BTB. Pt developed severe vasomotor sx 3/20 as well as fatigue, heat intolerance. She had high normal TSH 3/20 and enlarged thyroid on exam. Had micronodularity on thyroid u/s and positive thyroid ab.  Pt started on levo 25 mcg daily due to enlarged thyoid, hot flashes. Vasomotor sx improved but still sx. Still complains of fatigue. Getting about 6 hrs sleep nightly due to mind won't turn off. Has trouble falling and staying asleep. Tried melatonin (? Dose) but that makes her too tired when time to get up, although she does sleep much better with it. NO CHANGE IN HX FROM LAST YR. Hasn't tried HRT yet for sx.  Pt also complained of chronic sore throat for several months last yr. Has enlarged thyroid and felt like it was getting bigger and causing throat pain. Pt is euthyroid and had thyroid u/s 4/20. RT thyroid was 5x1.8x1.6 cm, LT thyroid was 4.8x1.7x1.5 cm, isthmus was 0.5 cm. No other allergy/resp sx. Saw ENT who changed GERD medication. Pt with maybe a little improvement.  Sex activity: not sex active; s/p tubal ligation. No vag sx. Last Pap: 06/02/18  Results were: no abnormalities /neg HPV DNA 2016; pap due today  Hx of STDs: none  Last mammogram: June 05, 2019  Results were: Cat 0 LT breast asymmetry--benign cyst 06/15/19; Repeat due in 12 months. There is a FH of breast cancer in her MGM and PGM, genetic testing not indicated. There is no FH of ovarian cancer. The patient does self-breast exams.  Tobacco use: none; stopped cigs 2018 Alcohol use: none No drug use Exercise: moderately active  She does get adequate calcium and Vitamin D in her diet. She has a hx Vit D deficiency.   Hx of borderline lipids with low HDL  and borderline LDL.  Due for labs.  Colonoscopy due to wt loss 2018. Repeat due in 5 yrs.    Past Medical History:  Diagnosis Date  . Abnormal uterine bleeding   . Anxiety   . Depression   . Dysmenorrhea   . GERD (gastroesophageal reflux disease)   . Hyperlipidemia   . Menorrhagia   . Ovarian cyst   . Premature menopause 2019  . Vitamin D deficiency     Past Surgical History:  Procedure Laterality Date  . COLONOSCOPY  2018   polyp; repeat in 5 yrs  . ESOPHAGOGASTRODUODENOSCOPY    . ROTATOR CUFF REPAIR    . SHOULDER ARTHROSCOPY    . TUBAL LIGATION      Family History  Problem Relation Age of Onset  . Breast cancer Maternal Grandmother 3  . Diabetes Mother   . Hypertension Mother   . Diabetes Father   . Breast cancer Paternal Grandmother 12  . Brain cancer Paternal Grandmother 63    Social History   Socioeconomic History  . Marital status: Single    Spouse name: Not on file  . Number of children: Not on file  . Years of education: Not on file  . Highest education level: Not on file  Occupational History  . Not on file  Tobacco Use  . Smoking status: Current Some Day Smoker  . Smokeless tobacco: Never Used  Vaping Use  .  Vaping Use: Never used  Substance and Sexual Activity  . Alcohol use: No  . Drug use: No  . Sexual activity: Not Currently    Birth control/protection: Surgical    Comment: Tubal ligation   Other Topics Concern  . Not on file  Social History Narrative  . Not on file   Social Determinants of Health   Financial Resource Strain:   . Difficulty of Paying Living Expenses:   Food Insecurity:   . Worried About Programme researcher, broadcasting/film/video in the Last Year:   . Barista in the Last Year:   Transportation Needs:   . Freight forwarder (Medical):   Marland Kitchen Lack of Transportation (Non-Medical):   Physical Activity:   . Days of Exercise per Week:   . Minutes of Exercise per Session:   Stress:   . Feeling of Stress :   Social Connections:    . Frequency of Communication with Friends and Family:   . Frequency of Social Gatherings with Friends and Family:   . Attends Religious Services:   . Active Member of Clubs or Organizations:   . Attends Banker Meetings:   Marland Kitchen Marital Status:   Intimate Partner Violence:   . Fear of Current or Ex-Partner:   . Emotionally Abused:   Marland Kitchen Physically Abused:   . Sexually Abused:     Current Outpatient Medications on File Prior to Visit  Medication Sig Dispense Refill  . amitriptyline (ELAVIL) 25 MG tablet Take 25 mg by mouth at bedtime.    Marland Kitchen levothyroxine (SYNTHROID) 25 MCG tablet Take 1 tablet (25 mcg total) by mouth daily before breakfast. 90 tablet 1  . meloxicam (MOBIC) 7.5 MG tablet 1 TABLET BY MOUTH EVERY DAY   TWICE A DAY AS NEEDED WITH FOOD    . naproxen sodium (ALEVE) 220 MG tablet Take by mouth.    . pantoprazole (PROTONIX) 40 MG tablet Take by mouth.    . SUMAtriptan (IMITREX) 50 MG tablet TAKE ONE AT THE ONSET OF HA, MAY TAKE SECOND PILL IF NO RELIEF     No current facility-administered medications on file prior to visit.      ROS:  Review of Systems  Constitutional: Positive for fatigue. Negative for fever and unexpected weight change.  Respiratory: Negative for cough, shortness of breath and wheezing.   Cardiovascular: Negative for chest pain, palpitations and leg swelling.  Gastrointestinal: Negative for blood in stool, constipation, diarrhea, nausea and vomiting.  Endocrine: Negative for cold intolerance, heat intolerance and polyuria.  Genitourinary: Negative for dyspareunia, dysuria, flank pain, frequency, genital sores, hematuria, menstrual problem, pelvic pain, urgency, vaginal bleeding, vaginal discharge and vaginal pain.  Musculoskeletal: Positive for arthralgias. Negative for back pain, joint swelling and myalgias.  Skin: Negative for rash.  Neurological: Negative for dizziness, syncope, light-headedness, numbness and headaches.  Hematological:  Negative for adenopathy.  Psychiatric/Behavioral: Negative for agitation, confusion, sleep disturbance and suicidal ideas. The patient is not nervous/anxious.      Objective: BP 100/70   Ht 5\' 4"  (1.626 m)   Wt 182 lb (82.6 kg)   BMI 31.24 kg/m    Physical Exam Constitutional:      Appearance: She is well-developed.  Genitourinary:     Vulva, vagina, right adnexa and left adnexa normal.     No vulval lesion or tenderness noted.     No vaginal discharge, erythema or tenderness.     No cervical motion tenderness or polyp.     Uterus  is tender.     Uterus is not enlarged.     No right or left adnexal mass present.     Right adnexa not tender.     Left adnexa not tender.  Neck:     Thyroid: No thyromegaly.  Cardiovascular:     Rate and Rhythm: Normal rate and regular rhythm.     Heart sounds: Normal heart sounds. No murmur heard.   Pulmonary:     Effort: Pulmonary effort is normal.     Breath sounds: Normal breath sounds.  Chest:     Breasts:        Right: No mass, nipple discharge, skin change or tenderness.        Left: No mass, nipple discharge, skin change or tenderness.  Abdominal:     General: There is no distension.     Palpations: Abdomen is soft. There is no mass.     Tenderness: There is no abdominal tenderness. There is no guarding.  Musculoskeletal:        General: Normal range of motion.     Cervical back: Normal range of motion.  Neurological:     General: No focal deficit present.     Mental Status: She is alert and oriented to person, place, and time.     Cranial Nerves: No cranial nerve deficit.  Skin:    General: Skin is warm and dry.  Psychiatric:        Mood and Affect: Mood normal.        Behavior: Behavior normal.        Thought Content: Thought content normal.        Judgment: Judgment normal.  Vitals reviewed.     Assessment/Plan:  Encounter for annual routine gynecological examination  Cervical cancer screening - Plan: Cytology -  PAP  Screening for HPV (human papillomavirus) - Plan: Cytology - PAP  Encounter for screening mammogram for malignant neoplasm of breast - Plan: MM 3D SCREEN BREAST BILATERAL; pt to sched mammo  Vasomotor symptoms due to [facility] thyroid labs. If WNL, discussed HRT and pt amenable to trying.   Other fatigue--increase exercise. See if HRT improves sx if we start it.  Blood tests for routine general physical examination - Plan: Comprehensive metabolic panel, Lipid panel, Hemoglobin A1c, TSH + free T4  Screening cholesterol level - Plan: Lipid panel  Screening for diabetes mellitus - Plan: Hemoglobin A1c  Enlarged thyroid - Plan: TSH + free T4; no thyroid u/s f/u due. Will RF levo Rx prn lab results.  Thyroid disorder screening - Plan: TSH + free T4           GYN counsel breast self exam, mammography screening, menopause, adequate intake of calcium and vitamin D, diet and exercise     F/U  Return in about 1 year (around 06/15/2021) for annual.  Altha Sweitzer B. Azya Barbero, PA-C 06/15/2020 2:46 PM

## 2020-06-17 ENCOUNTER — Other Ambulatory Visit: Payer: Managed Care, Other (non HMO)

## 2020-06-17 ENCOUNTER — Telehealth: Payer: Self-pay | Admitting: Obstetrics and Gynecology

## 2020-06-17 LAB — CYTOLOGY - PAP
Comment: NEGATIVE
Diagnosis: NEGATIVE
High risk HPV: NEGATIVE

## 2020-06-17 NOTE — Telephone Encounter (Signed)
Patient is calling wanting her labs released so she can go to an outside [facility] for her labs. Please advise

## 2020-06-17 NOTE — Telephone Encounter (Signed)
Done

## 2020-06-23 LAB — COMPREHENSIVE METABOLIC PANEL
ALT: 49 IU/L — ABNORMAL HIGH (ref 0–32)
AST: 38 IU/L (ref 0–40)
Albumin/Globulin Ratio: 1.5 (ref 1.2–2.2)
Albumin: 4.5 g/dL (ref 3.8–4.8)
Alkaline Phosphatase: 119 IU/L (ref 48–121)
BUN/Creatinine Ratio: 10 (ref 9–23)
BUN: 8 mg/dL (ref 6–24)
Bilirubin Total: 0.5 mg/dL (ref 0.0–1.2)
CO2: 24 mmol/L (ref 20–29)
Calcium: 9.8 mg/dL (ref 8.7–10.2)
Chloride: 105 mmol/L (ref 96–106)
Creatinine, Ser: 0.78 mg/dL (ref 0.57–1.00)
GFR calc Af Amer: 106 mL/min/{1.73_m2} (ref >59)
GFR calc non Af Amer: 92 mL/min/{1.73_m2} (ref >59)
Globulin, Total: 3 g/dL (ref 1.5–4.5)
Glucose: 92 mg/dL (ref 65–99)
Potassium: 4.3 mmol/L (ref 3.5–5.2)
Sodium: 142 mmol/L (ref 134–144)
Total Protein: 7.5 g/dL (ref 6.0–8.5)

## 2020-06-23 LAB — LIPID PANEL
Chol/HDL Ratio: 5.2 ratio — ABNORMAL HIGH (ref 0.0–4.4)
Cholesterol, Total: 213 mg/dL — ABNORMAL HIGH (ref 100–199)
HDL: 41 mg/dL (ref >39)
LDL Chol Calc (NIH): 150 mg/dL — ABNORMAL HIGH (ref 0–99)
Triglycerides: 122 mg/dL (ref 0–149)
VLDL Cholesterol Cal: 22 mg/dL (ref 5–40)

## 2020-06-23 LAB — HEMOGLOBIN A1C
Est. average glucose Bld gHb Est-mCnc: 111 mg/dL
Hgb A1c MFr Bld: 5.5 % (ref 4.8–5.6)

## 2020-06-23 LAB — TSH+FREE T4
Free T4: 1.21 ng/dL (ref 0.82–1.77)
TSH: 1.98 u[IU]/mL (ref 0.450–4.500)

## 2020-06-24 ENCOUNTER — Encounter: Payer: Self-pay | Admitting: Obstetrics and Gynecology

## 2020-06-26 ENCOUNTER — Other Ambulatory Visit: Payer: Self-pay | Admitting: Obstetrics and Gynecology

## 2020-06-26 DIAGNOSIS — R6889 Other general symptoms and signs: Secondary | ICD-10-CM

## 2020-06-26 DIAGNOSIS — R5383 Other fatigue: Secondary | ICD-10-CM

## 2020-06-26 MED ORDER — LEVOTHYROXINE SODIUM 25 MCG PO TABS: 25.0000 ug | ORAL_TABLET | Freq: Every day | ORAL | 3 refills | Status: AC

## 2020-06-26 NOTE — Progress Notes (Signed)
Rx RF levo 25 mcg daily. Rechk thyroid labs in 1 yr.

## 2020-06-27 ENCOUNTER — Other Ambulatory Visit: Payer: Self-pay | Admitting: Obstetrics and Gynecology

## 2020-06-27 MED ORDER — ESTRADIOL-NORETHINDRONE ACET 0.5-0.1 MG PO TABS: 1.0000 | ORAL_TABLET | Freq: Every day | ORAL | 3 refills | Status: DC

## 2020-06-27 NOTE — Progress Notes (Signed)
Rx activella start for menopausal sx.

## 2020-07-13 ENCOUNTER — Ambulatory Visit
Admission: RE | Admit: 2020-07-13 | Discharge: 2020-07-13 | Disposition: A | Discharge status: Home / Self Care | Payer: Managed Care, Other (non HMO) | Source: Ambulatory Visit | Attending: Obstetrics and Gynecology | Admitting: Obstetrics and Gynecology

## 2020-07-13 ENCOUNTER — Other Ambulatory Visit: Payer: Self-pay

## 2020-07-13 DIAGNOSIS — Z1231 Encounter for screening mammogram for malignant neoplasm of breast: Secondary | ICD-10-CM | POA: Diagnosis present

## 2020-07-15 ENCOUNTER — Encounter: Payer: Self-pay | Admitting: Obstetrics and Gynecology

## 2020-07-17 IMAGING — MG DIGITAL SCREENING BILATERAL MAMMOGRAM WITH TOMO AND CAD
6 of 10 series · 6 of 30 positions shown · non-contrast
Comparison: Previous exam(s).

CLINICAL DATA: Screening.

EXAM:
DIGITAL SCREENING BILATERAL MAMMOGRAM WITH TOMO AND CAD

[R CC synth-2D (1 of 2)]
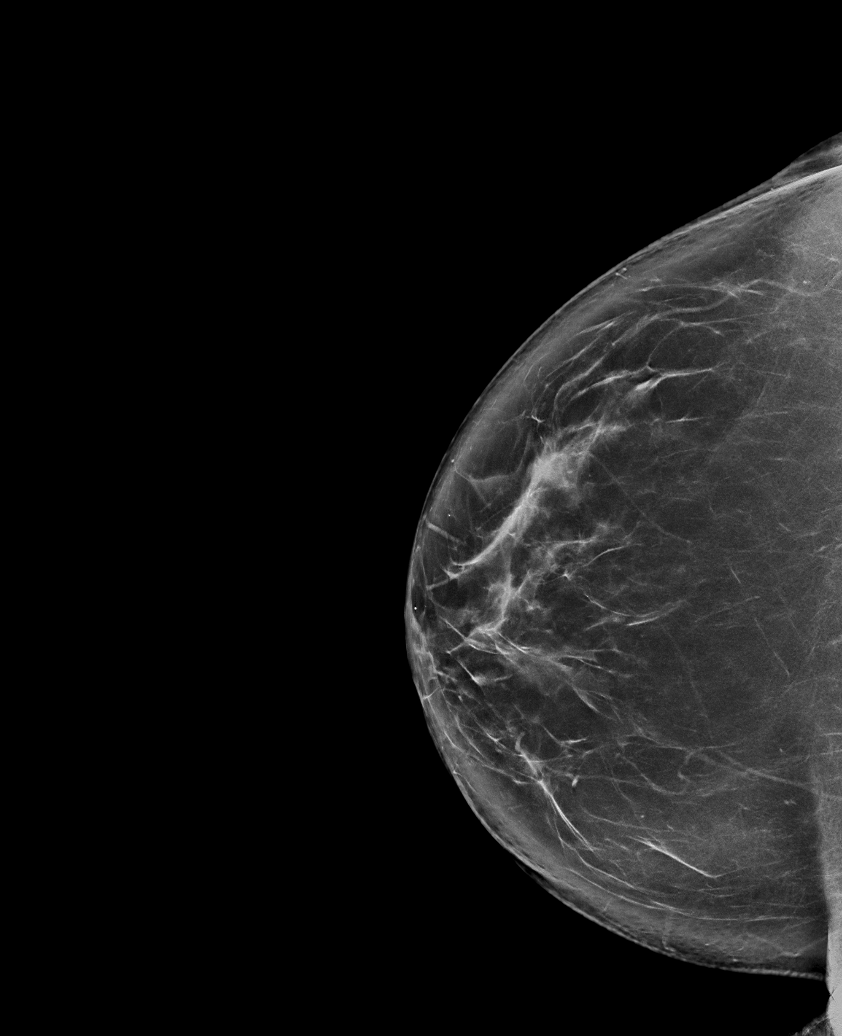

[R CC synth-2D (2 of 2)]
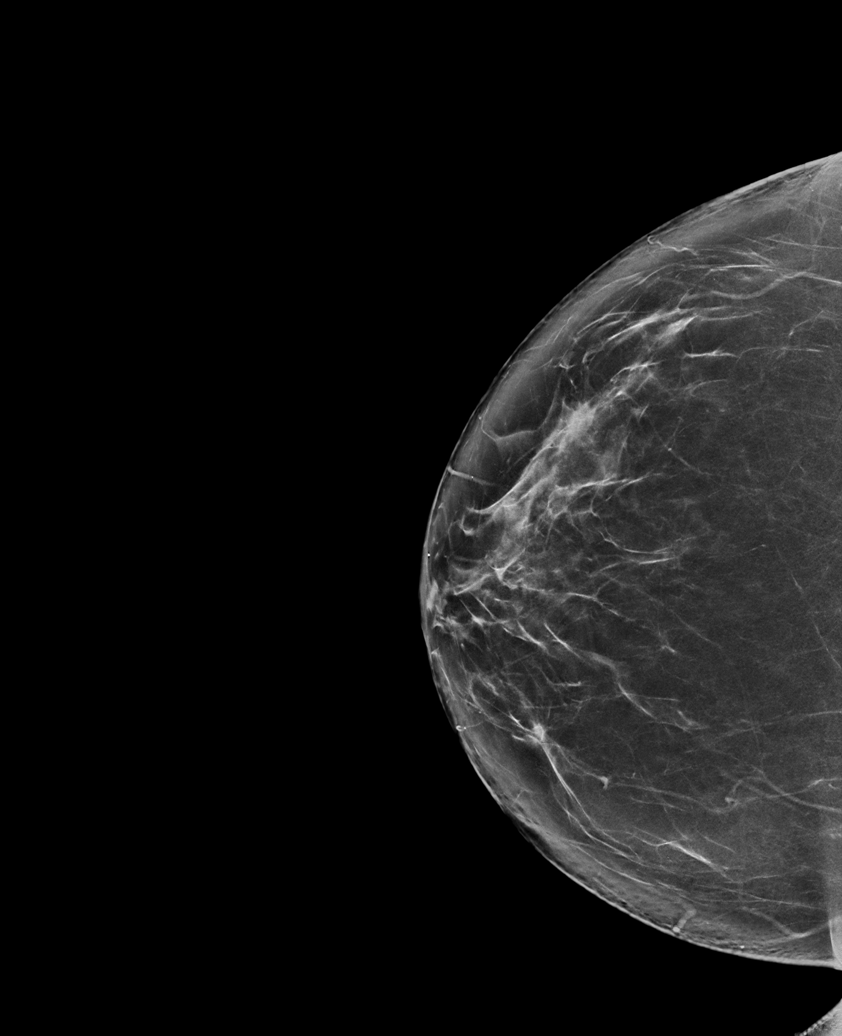

[L CC synth-2D]
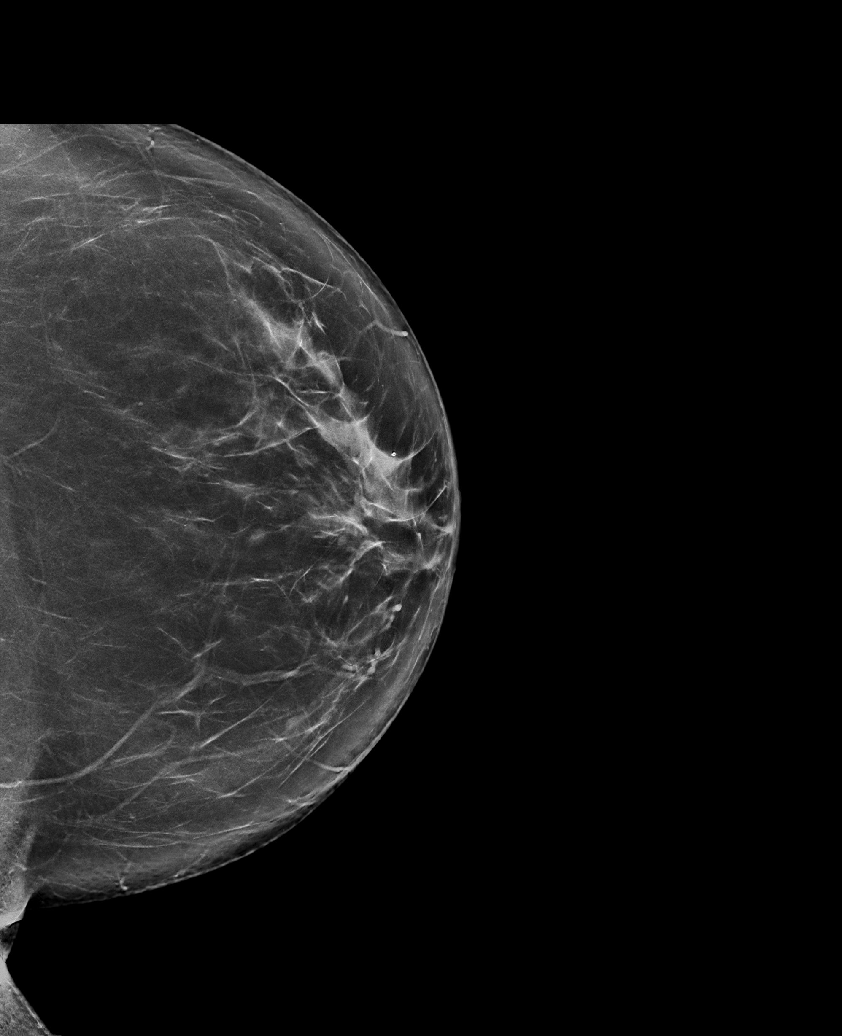

[R MLO synth-2D]
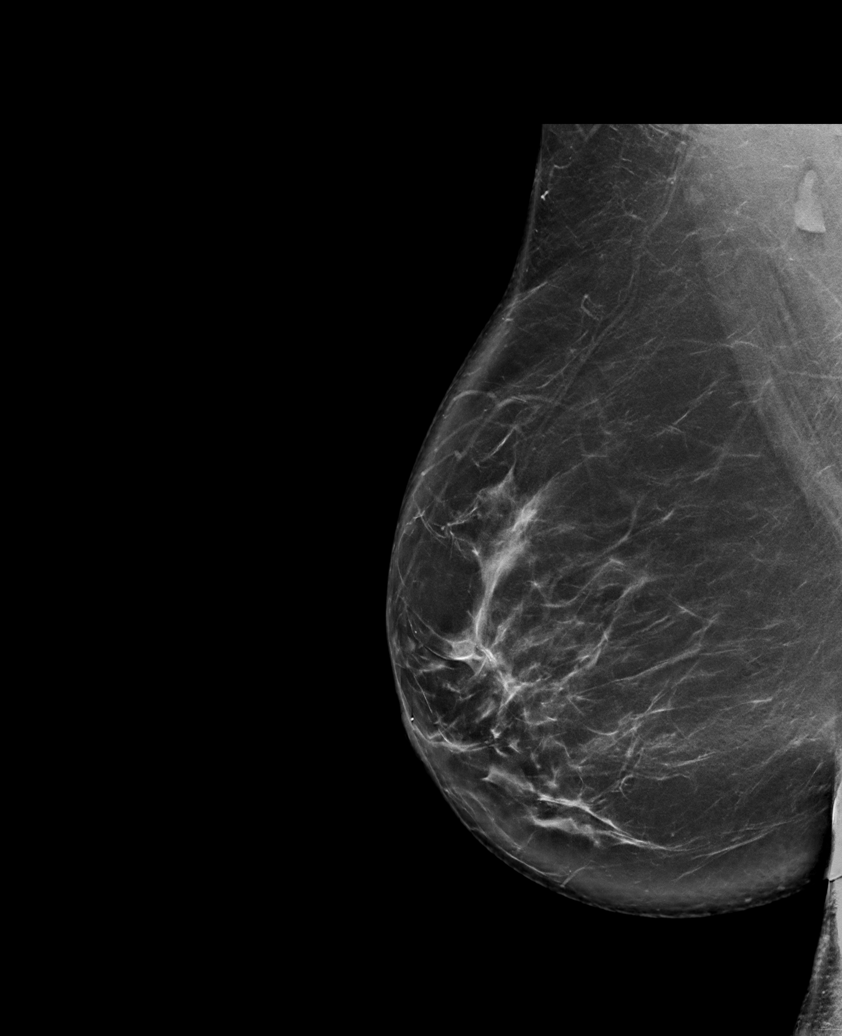

[L MLO synth-2D]
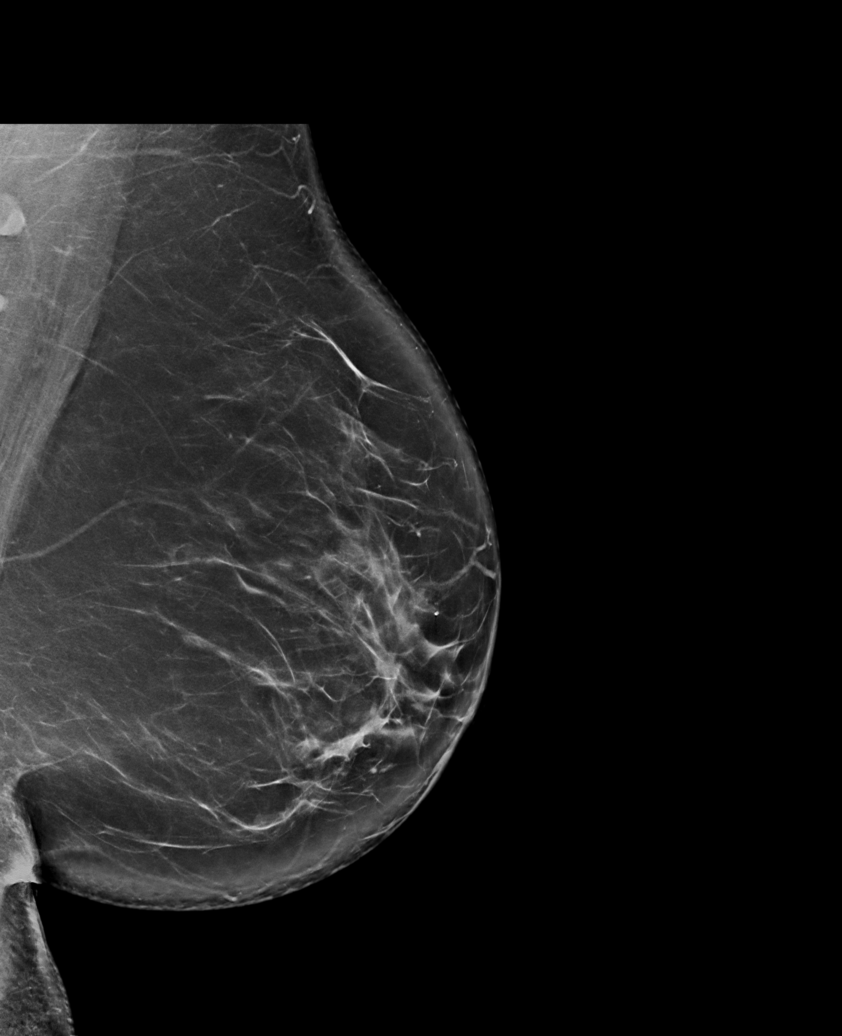

[L MLO tomo · tomo slice 49/96.0]
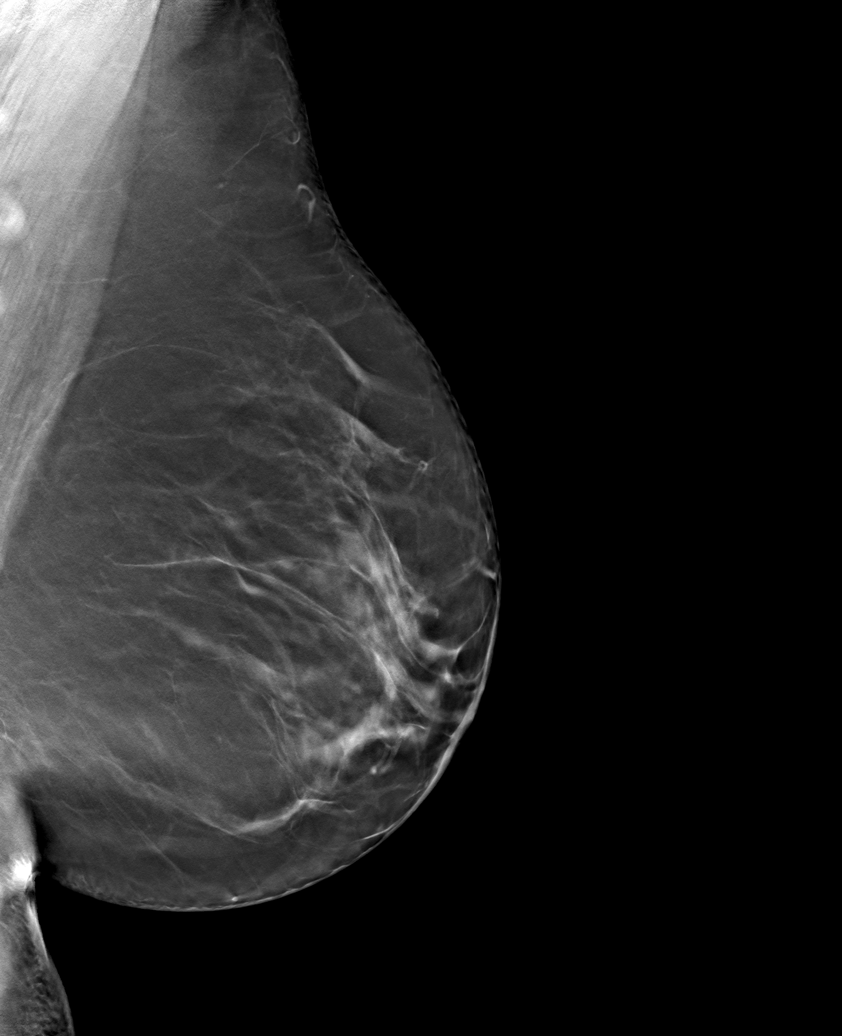

[6 of 30 positions shown; findings below may reference images not displayed]

ACR Breast Density Category b: There are scattered areas of
fibroglandular density.
FINDINGS: In the left breast, a possible asymmetry warrants further
evaluation. In the right breast, no findings suspicious for
malignancy. Images were processed with CAD.
IMPRESSION: Further evaluation is suggested for possible asymmetry in the left
breast.

RECOMMENDATION:
Diagnostic mammogram and possibly ultrasound of the left breast.
(Code:DI-5-LL4)

The patient will be contacted regarding the findings, and additional
imaging will be scheduled.

BI-RADS CATEGORY  0: Incomplete. Need additional imaging evaluation
and/or prior mammograms for comparison.

## 2020-11-28 ENCOUNTER — Other Ambulatory Visit: Payer: Self-pay | Admitting: Obstetrics and Gynecology

## 2020-12-02 ENCOUNTER — Encounter: Payer: Self-pay | Admitting: Obstetrics and Gynecology

## 2021-07-06 ENCOUNTER — Other Ambulatory Visit: Payer: Self-pay | Admitting: Obstetrics and Gynecology

## 2021-07-06 DIAGNOSIS — R6889 Other general symptoms and signs: Secondary | ICD-10-CM

## 2021-07-06 DIAGNOSIS — R5383 Other fatigue: Secondary | ICD-10-CM

## 2021-09-05 NOTE — Progress Notes (Signed)
Chief Complaint  Patient presents with   Gynecologic Exam    No concerns     HPI:      [name] is a 45 y.o. [id number] who LMP was No LMP recorded. (Menstrual status: Irregular Periods)., presents today for her annual examination. Her menses are absent since 04/2018. No PMB. Pt developed severe vasomotor sx 3/20 as well as fatigue, heat intolerance. She had high normal TSH 3/20 and enlarged thyroid on exam. Had micronodularity on thyroid u/s and positive thyroid ab.  Pt started on levo 25 mcg daily due to enlarged thyoid, hot flashes. Vasomotor sx improved but still sx. Tried activella last yr for 1 mo but too expensive with insurance, so pt stopped it. No sx change after 1 mo. Willing to try it again.   Has enlarged thyroid; RT thyroid was 5x1.8x1.6 cm, LT thyroid was 4.8x1.7x1.5 cm, isthmus was 0.5 cm on 7/20 thyroid u/s. No fruther imaging needed at this time.  Pt is euthyroid with levo 25 mcg dose.  Sex activity: not sex active; s/p tubal ligation. No vag sx. Last Pap: 06/15/20  Results were: no abnormalities /neg HPV DNA 2016 Hx of STDs: none   Last mammogram: 07/13/20  Results were normal,  Repeat due in 12 months. There is a FH of breast cancer in her MGM and PGM, genetic testing not indicated. There is no FH of ovarian cancer. The patient does self-breast exams.   Tobacco use: none; stopped cigs 2018 Alcohol use: none No drug use Exercise: moderately active   She does get adequate calcium and Vitamin D in her diet. She has a hx Vit D deficiency.   Hx of borderline lipids with low HDL and borderline LDL.  Due for labs. Brother died with liver cirrhosis due to alcohol use this past yr. Pt with slightly elevated ALT last yr, fairly high LFTs 10/20. Will repeat this yr.  Colonoscopy due to wt loss 2018. Repeat due in 5 yrs.     Past Medical History:  Diagnosis Date   Abnormal uterine bleeding    Anxiety    Depression    Dysmenorrhea    GERD (gastroesophageal  reflux disease)    Hyperlipidemia    Menorrhagia    Ovarian cyst    Premature menopause 2019   Vitamin D deficiency     Past Surgical History:  Procedure Laterality Date   COLONOSCOPY  2018   polyp; repeat in 5 yrs   ESOPHAGOGASTRODUODENOSCOPY     ROTATOR CUFF REPAIR     SHOULDER ARTHROSCOPY     TUBAL LIGATION      Family History  Problem Relation Age of Onset   Breast cancer Maternal Grandmother 25   Diabetes Mother    Hypertension Mother    Diabetes Father    Breast cancer Paternal Grandmother 37   Brain cancer Paternal Grandmother 50    Social History   Socioeconomic History   Marital status: Single    Spouse name: Not on file   Number of children: Not on file   Years of education: Not on file   Highest education level: Not on file  Occupational History   Not on file  Tobacco Use   Smoking status: Some Days   Smokeless tobacco: Never  Vaping Use   Vaping Use: Never used  Substance and Sexual Activity   Alcohol use: No   Drug use: No   Sexual activity: Not Currently    Birth control/protection: Surgical  Comment: Tubal ligation   Other Topics Concern   Not on file  Social History Narrative   Not on file   Social Determinants of Health   Financial Resource Strain: Not on file  Food Insecurity: Not on file  Transportation Needs: Not on file  Physical Activity: Not on file  Stress: Not on file  Social Connections: Not on file  Intimate Partner Violence: Not on file    Current Outpatient Medications on File Prior to Visit  Medication Sig Dispense Refill   amitriptyline (ELAVIL) 25 MG tablet Take 25 mg by mouth at bedtime.     levothyroxine (SYNTHROID) 25 MCG tablet Take 1 tablet (25 mcg total) by mouth daily before breakfast. 90 tablet 3   naproxen sodium (ALEVE) 220 MG tablet Take by mouth.     pantoprazole (PROTONIX) 40 MG tablet Take by mouth.     SODIUM FLUORIDE 5000 SENSITIVE 1.1-5 % GEL PLEASE SEE ATTACHED FOR DETAILED DIRECTIONS     No  current facility-administered medications on file prior to visit.      ROS:  Review of Systems  Constitutional:  Negative for fatigue, fever and unexpected weight change.  Respiratory:  Negative for cough, shortness of breath and wheezing.   Cardiovascular:  Negative for chest pain, palpitations and leg swelling.  Gastrointestinal:  Negative for blood in stool, constipation, diarrhea, nausea and vomiting.  Endocrine: Negative for cold intolerance, heat intolerance and polyuria.  Genitourinary:  Negative for dyspareunia, dysuria, flank pain, frequency, genital sores, hematuria, menstrual problem, pelvic pain, urgency, vaginal bleeding, vaginal discharge and vaginal pain.  Musculoskeletal:  Negative for arthralgias, back pain, joint swelling and myalgias.  Skin:  Negative for rash.  Neurological:  Negative for dizziness, syncope, light-headedness, numbness and headaches.  Hematological:  Negative for adenopathy.  Psychiatric/Behavioral:  Negative for agitation, confusion, sleep disturbance and suicidal ideas. The patient is not nervous/anxious.     Objective: BP 100/70   Ht 5\' 4"  (1.626 m)   Wt 184 lb (83.5 kg)   BMI 31.58 kg/m    Physical Exam Constitutional:      Appearance: She is well-developed.  Genitourinary:     Vulva normal.     Right Labia: No rash, tenderness or lesions.    Left Labia: No tenderness, lesions or rash.    No vaginal discharge, erythema or tenderness.      Right Adnexa: not tender and no mass present.    Left Adnexa: not tender and no mass present.    No cervical friability or polyp.     Uterus is not enlarged or tender.  Breasts:    Right: No mass, nipple discharge, skin change or tenderness.     Left: No mass, nipple discharge, skin change or tenderness.  Neck:     Thyroid: No thyromegaly.  Cardiovascular:     Rate and Rhythm: Normal rate and regular rhythm.     Heart sounds: Normal heart sounds. No murmur heard. Pulmonary:     Effort:  Pulmonary effort is normal.     Breath sounds: Normal breath sounds.  Abdominal:     Palpations: Abdomen is soft.     Tenderness: There is no abdominal tenderness. There is no guarding or rebound.  Musculoskeletal:        General: Normal range of motion.     Cervical back: Normal range of motion.  Lymphadenopathy:     Cervical: No cervical adenopathy.  Neurological:     General: No focal deficit present.  Mental Status: She is alert and oriented to person, place, and time.     Cranial Nerves: No cranial nerve deficit.  Skin:    General: Skin is warm and dry.  Psychiatric:        Mood and Affect: Mood normal.        Behavior: Behavior normal.        Thought Content: Thought content normal.        Judgment: Judgment normal.  Vitals reviewed.    Assessment/Plan: Encounter for annual routine gynecological examination  Encounter for screening mammogram for malignant neoplasm of breast - Plan: MM 3D SCREEN BREAST BILATERAL; pt to sched mammo  Enlarged thyroid - Plan: TSH + free T4; check labs. Will RF levo based on results. No need for u/s f/u currently.   Premature menopause - Plan: estradiol (ESTRACE) 0.5 MG tablet, progesterone (PROMETRIUM) 100 MG capsule; try different HRT due to cost. Discussed benefits of estrogen with premature menopause   Vasomotor symptoms due to menopause - Plan: estradiol (ESTRACE) 0.5 MG tablet, progesterone (PROMETRIUM) 100 MG capsule; Rx eRxd. F/u prn.   Hormone replacement therapy (HRT) - Plan: estradiol (ESTRACE) 0.5 MG tablet, progesterone (PROMETRIUM) 100 MG capsule  Blood tests for routine general physical examination - Plan: Comprehensive metabolic panel  Elevated LDL cholesterol level - Plan: Lipid panel  Screening cholesterol level - Plan: Lipid panel  Elevated LFTs--will f/u with results. Will check liver u/s if significantly elevated.   Meds ordered this encounter  Medications   estradiol (ESTRACE) 0.5 MG tablet    Sig: Take 1  tablet (0.5 mg total) by mouth daily.    Dispense:  90 tablet    Refill:  3    Order Specific Question:   Supervising Provider    Answer:   Nadara Mustard [[id number]]   progesterone (PROMETRIUM) 100 MG capsule    Sig: Take 1 capsule (100 mg total) by mouth daily.    Dispense:  90 capsule    Refill:  3    Order Specific Question:   Supervising Provider    Answer:   Nadara Mustard [[id number]]             GYN counsel breast self exam, mammography screening, menopause, adequate intake of calcium and vitamin D, diet and exercise     F/U  Return in about 1 year (around 09/06/2022).  Artemisa Sladek B. Kathryn Linarez, PA-C 09/06/2021 9:04 AM

## 2021-09-06 ENCOUNTER — Ambulatory Visit (INDEPENDENT_AMBULATORY_CARE_PROVIDER_SITE_OTHER): Payer: Managed Care, Other (non HMO) | Admitting: Obstetrics and Gynecology

## 2021-09-06 ENCOUNTER — Other Ambulatory Visit: Payer: Self-pay

## 2021-09-06 ENCOUNTER — Encounter: Payer: Self-pay | Admitting: Obstetrics and Gynecology

## 2021-09-06 VITALS — BP 100/70 | Ht 64.0 in | Wt 184.0 lb

## 2021-09-06 DIAGNOSIS — Z0000 Encounter for general adult medical examination without abnormal findings: Secondary | ICD-10-CM

## 2021-09-06 DIAGNOSIS — E28319 Asymptomatic premature menopause: Secondary | ICD-10-CM | POA: Diagnosis not present

## 2021-09-06 DIAGNOSIS — Z1231 Encounter for screening mammogram for malignant neoplasm of breast: Secondary | ICD-10-CM

## 2021-09-06 DIAGNOSIS — E049 Nontoxic goiter, unspecified: Secondary | ICD-10-CM | POA: Diagnosis not present

## 2021-09-06 DIAGNOSIS — Z79890 Hormone replacement therapy (postmenopausal): Secondary | ICD-10-CM

## 2021-09-06 DIAGNOSIS — N951 Menopausal and female climacteric states: Secondary | ICD-10-CM

## 2021-09-06 DIAGNOSIS — Z13220 Encounter for screening for lipoid disorders: Secondary | ICD-10-CM

## 2021-09-06 DIAGNOSIS — Z01419 Encounter for gynecological examination (general) (routine) without abnormal findings: Secondary | ICD-10-CM

## 2021-09-06 DIAGNOSIS — R7989 Other specified abnormal findings of blood chemistry: Secondary | ICD-10-CM | POA: Insufficient documentation

## 2021-09-06 DIAGNOSIS — E7800 Pure hypercholesterolemia, unspecified: Secondary | ICD-10-CM

## 2021-09-06 MED ORDER — PROGESTERONE MICRONIZED 100 MG PO CAPS
100.0000 mg | ORAL_CAPSULE | Freq: Every day | ORAL | 3 refills | Status: DC
Start: 2021-09-06 — End: 2022-09-14

## 2021-09-06 MED ORDER — ESTRADIOL 0.5 MG PO TABS: 0.5000 mg | ORAL_TABLET | Freq: Every day | ORAL | 3 refills | Status: DC

## 2021-09-06 NOTE — Patient Instructions (Addendum)
I value your feedback and you entrusting us with your care. If you get a [facility] patient survey, I would appreciate you taking the time to let us know about your experience today. Thank you!  [facility]: [phone]      

## 2021-09-07 LAB — COMPREHENSIVE METABOLIC PANEL
ALT: 51 IU/L — ABNORMAL HIGH (ref 0–32)
AST: 42 IU/L — ABNORMAL HIGH (ref 0–40)
Albumin/Globulin Ratio: 1.7 (ref 1.2–2.2)
Albumin: 4.7 g/dL (ref 3.8–4.8)
Alkaline Phosphatase: 114 IU/L (ref 44–121)
BUN/Creatinine Ratio: 16 (ref 9–23)
BUN: 11 mg/dL (ref 6–24)
Bilirubin Total: 0.5 mg/dL (ref 0.0–1.2)
CO2: 21 mmol/L (ref 20–29)
Calcium: 9.6 mg/dL (ref 8.7–10.2)
Chloride: 103 mmol/L (ref 96–106)
Creatinine, Ser: 0.69 mg/dL (ref 0.57–1.00)
Globulin, Total: 2.8 g/dL (ref 1.5–4.5)
Glucose: 92 mg/dL (ref 70–99)
Potassium: 4.3 mmol/L (ref 3.5–5.2)
Sodium: 142 mmol/L (ref 134–144)
Total Protein: 7.5 g/dL (ref 6.0–8.5)
eGFR: 108 mL/min/{1.73_m2} (ref >59)

## 2021-09-07 LAB — LIPID PANEL
Chol/HDL Ratio: 4.4 ratio (ref 0.0–4.4)
Cholesterol, Total: 193 mg/dL (ref 100–199)
HDL: 44 mg/dL (ref >39)
LDL Chol Calc (NIH): 128 mg/dL — ABNORMAL HIGH (ref 0–99)
Triglycerides: 116 mg/dL (ref 0–149)
VLDL Cholesterol Cal: 21 mg/dL (ref 5–40)

## 2021-09-07 LAB — TSH+FREE T4
Free T4: 1.14 ng/dL (ref 0.82–1.77)
TSH: 2.38 u[IU]/mL (ref 0.450–4.500)

## 2021-09-27 ENCOUNTER — Ambulatory Visit
Admission: RE | Admit: 2021-09-27 | Discharge: 2021-09-27 | Disposition: A | Discharge status: Home / Self Care | Payer: Managed Care, Other (non HMO) | Source: Ambulatory Visit | Attending: Obstetrics and Gynecology | Admitting: Obstetrics and Gynecology

## 2021-09-27 ENCOUNTER — Other Ambulatory Visit: Payer: Self-pay

## 2021-09-27 DIAGNOSIS — Z1231 Encounter for screening mammogram for malignant neoplasm of breast: Secondary | ICD-10-CM | POA: Diagnosis not present

## 2022-05-18 ENCOUNTER — Other Ambulatory Visit: Payer: Self-pay | Admitting: Obstetrics and Gynecology

## 2022-08-14 ENCOUNTER — Other Ambulatory Visit: Payer: Self-pay | Admitting: Obstetrics and Gynecology

## 2022-08-14 DIAGNOSIS — Z1231 Encounter for screening mammogram for malignant neoplasm of breast: Secondary | ICD-10-CM

## 2022-09-14 ENCOUNTER — Telehealth: Payer: Self-pay

## 2022-09-14 DIAGNOSIS — Z79890 Hormone replacement therapy (postmenopausal): Secondary | ICD-10-CM

## 2022-09-14 DIAGNOSIS — E28319 Asymptomatic premature menopause: Secondary | ICD-10-CM

## 2022-09-14 DIAGNOSIS — N951 Menopausal and female climacteric states: Secondary | ICD-10-CM

## 2022-09-14 MED ORDER — ESTRADIOL 0.5 MG PO TABS: 0.5000 mg | ORAL_TABLET | Freq: Every day | ORAL | 0 refills | Status: DC

## 2022-09-14 MED ORDER — PROGESTERONE MICRONIZED 100 MG PO CAPS
100.0000 mg | ORAL_CAPSULE | Freq: Every day | ORAL | 0 refills | Status: DC
Start: 2022-09-14 — End: 2022-12-03

## 2022-09-14 NOTE — Telephone Encounter (Signed)
Received fax from pharmacy to RF estradiol and progesterone Rx's. [facility] approved but fax is not going through. Called pharmacy to see if verbal can be taken and per pharm tech verbals are NOT accepted. They do electronic Rx's so sending RF's now.

## 2022-09-30 NOTE — Progress Notes (Deleted)
No chief complaint on file.    HPI:      Ms. ABRIE EGLOFF is a 46 y.o. [phone] who LMP was No LMP recorded. (Menstrual status: Irregular Periods)., presents today for her annual examination. Her menses are absent since 04/2018. No PMB. Pt developed severe vasomotor sx 3/20 as well as fatigue, heat intolerance. She had high normal TSH 3/20 and enlarged thyroid on exam. Had micronodularity on thyroid u/s and positive thyroid ab.  Pt started on levo 25 mcg daily due to enlarged thyoid, hot flashes. Vasomotor sx improved but still sx. Tried activella last yr for 1 mo but too expensive with insurance, so pt stopped it. No sx change after 1 mo. Willing to try it again.   Has enlarged thyroid; RT thyroid was 5x1.8x1.6 cm, LT thyroid was 4.8x1.7x1.5 cm, isthmus was 0.5 cm on 7/20 thyroid u/s. No fruther imaging needed at this time.  Pt is euthyroid with levo 25 mcg dose.  Sex activity: not sex active; s/p tubal ligation. No vag sx. Last Pap: 06/15/20  Results were: no abnormalities /neg HPV DNA Hx of STDs: none   Last mammogram: 09/27/21  Results were normal,  Repeat due in 12 months, has appt There is a FH of breast cancer in her MGM and PGM, genetic testing not indicated. There is no FH of ovarian cancer. The patient does self-breast exams.   Tobacco use: none; stopped cigs 2018 Alcohol use: none No drug use Exercise: moderately active   She does get adequate calcium and Vitamin D in her diet. She has a hx Vit D deficiency.   Hx of borderline lipids with low HDL and borderline LDL.  Due for labs. Brother died with liver cirrhosis due to alcohol use this past yr. Pt with slightly elevated ALT last yr, fairly high LFTs 10/20. Will repeat this yr.  Colonoscopy due to wt loss 2018. Repeat due in 5 yrs.     Past Medical History:  Diagnosis Date   Abnormal uterine bleeding    Anxiety    Depression    Dysmenorrhea    GERD (gastroesophageal reflux disease)    Hyperlipidemia     Menorrhagia    Ovarian cyst    Premature menopause 2019   Vitamin D deficiency     Past Surgical History:  Procedure Laterality Date   COLONOSCOPY  2018   polyp; repeat in 5 yrs   ESOPHAGOGASTRODUODENOSCOPY     ROTATOR CUFF REPAIR     SHOULDER ARTHROSCOPY     TUBAL LIGATION      Family History  Problem Relation Age of Onset   Breast cancer Maternal Grandmother 83   Diabetes Mother    Hypertension Mother    Diabetes Father    Breast cancer Paternal Grandmother 66   Brain cancer Paternal Grandmother 71    Social History   Socioeconomic History   Marital status: Married    Spouse name: Not on file   Number of children: Not on file   Years of education: Not on file   Highest education level: Not on file  Occupational History   Not on file  Tobacco Use   Smoking status: Some Days   Smokeless tobacco: Never  Vaping Use   Vaping Use: Never used  Substance and Sexual Activity   Alcohol use: No   Drug use: No   Sexual activity: Not Currently    Birth control/protection: Surgical    Comment: Tubal ligation   Other Topics Concern  Not on file  Social History Narrative   Not on file   Social Determinants of Health   Financial Resource Strain: Not on file  Food Insecurity: Not on file  Transportation Needs: Not on file  Physical Activity: Not on file  Stress: Not on file  Social Connections: Not on file  Intimate Partner Violence: Not on file    Current Outpatient Medications on File Prior to Visit  Medication Sig Dispense Refill   amitriptyline (ELAVIL) 25 MG tablet Take 25 mg by mouth at bedtime.     estradiol (ESTRACE) 0.5 MG tablet Take 1 tablet (0.5 mg total) by mouth daily. 90 tablet 0   levothyroxine (SYNTHROID) 25 MCG tablet Take 1 tablet (25 mcg total) by mouth daily before breakfast. 90 tablet 3   naproxen sodium (ALEVE) 220 MG tablet Take by mouth.     pantoprazole (PROTONIX) 40 MG tablet Take by mouth.     progesterone (PROMETRIUM) 100 MG capsule  Take 1 capsule (100 mg total) by mouth daily. 90 capsule 0   SODIUM FLUORIDE 5000 SENSITIVE 1.1-5 % GEL PLEASE SEE ATTACHED FOR DETAILED DIRECTIONS     No current facility-administered medications on file prior to visit.      ROS:  Review of Systems  Constitutional:  Negative for fatigue, fever and unexpected weight change.  Respiratory:  Negative for cough, shortness of breath and wheezing.   Cardiovascular:  Negative for chest pain, palpitations and leg swelling.  Gastrointestinal:  Negative for blood in stool, constipation, diarrhea, nausea and vomiting.  Endocrine: Negative for cold intolerance, heat intolerance and polyuria.  Genitourinary:  Negative for dyspareunia, dysuria, flank pain, frequency, genital sores, hematuria, menstrual problem, pelvic pain, urgency, vaginal bleeding, vaginal discharge and vaginal pain.  Musculoskeletal:  Negative for arthralgias, back pain, joint swelling and myalgias.  Skin:  Negative for rash.  Neurological:  Negative for dizziness, syncope, light-headedness, numbness and headaches.  Hematological:  Negative for adenopathy.  Psychiatric/Behavioral:  Negative for agitation, confusion, sleep disturbance and suicidal ideas. The patient is not nervous/anxious.      Objective: There were no vitals taken for this visit.   Physical Exam Constitutional:      Appearance: She is well-developed.  Genitourinary:     Vulva normal.     Right Labia: No rash, tenderness or lesions.    Left Labia: No tenderness, lesions or rash.    No vaginal discharge, erythema or tenderness.      Right Adnexa: not tender and no mass present.    Left Adnexa: not tender and no mass present.    No cervical friability or polyp.     Uterus is not enlarged or tender.  Breasts:    Right: No mass, nipple discharge, skin change or tenderness.     Left: No mass, nipple discharge, skin change or tenderness.  Neck:     Thyroid: No thyromegaly.  Cardiovascular:     Rate and  Rhythm: Normal rate and regular rhythm.     Heart sounds: Normal heart sounds. No murmur heard. Pulmonary:     Effort: Pulmonary effort is normal.     Breath sounds: Normal breath sounds.  Abdominal:     Palpations: Abdomen is soft.     Tenderness: There is no abdominal tenderness. There is no guarding or rebound.  Musculoskeletal:        General: Normal range of motion.     Cervical back: Normal range of motion.  Lymphadenopathy:     Cervical: No cervical  adenopathy.  Neurological:     General: No focal deficit present.     Mental Status: She is alert and oriented to person, place, and time.     Cranial Nerves: No cranial nerve deficit.  Skin:    General: Skin is warm and dry.  Psychiatric:        Mood and Affect: Mood normal.        Behavior: Behavior normal.        Thought Content: Thought content normal.        Judgment: Judgment normal.  Vitals reviewed.     Assessment/Plan: Encounter for annual routine gynecological examination  Encounter for screening mammogram for malignant neoplasm of breast - Plan: MM 3D SCREEN BREAST BILATERAL; pt to sched mammo  Enlarged thyroid - Plan: TSH + free T4; check labs. Will RF levo based on results. No need for u/s f/u currently.   Premature menopause - Plan: estradiol (ESTRACE) 0.5 MG tablet, progesterone (PROMETRIUM) 100 MG capsule; try different HRT due to cost. Discussed benefits of estrogen with premature menopause   Vasomotor symptoms due to menopause - Plan: estradiol (ESTRACE) 0.5 MG tablet, progesterone (PROMETRIUM) 100 MG capsule; Rx eRxd. F/u prn.   Hormone replacement therapy (HRT) - Plan: estradiol (ESTRACE) 0.5 MG tablet, progesterone (PROMETRIUM) 100 MG capsule  Blood tests for routine general physical examination - Plan: Comprehensive metabolic panel  Elevated LDL cholesterol level - Plan: Lipid panel  Screening cholesterol level - Plan: Lipid panel  Elevated LFTs--will f/u with results. Will check liver u/s if  significantly elevated.   No orders of the defined types were placed in this encounter.            GYN counsel breast self exam, mammography screening, menopause, adequate intake of calcium and vitamin D, diet and exercise     F/U  No follow-ups on file.  Jossue Rubenstein B. Tijah Hane, PA-C 09/30/2022 6:42 PM

## 2022-10-01 ENCOUNTER — Ambulatory Visit: Payer: Managed Care, Other (non HMO) | Admitting: Obstetrics and Gynecology

## 2022-10-01 DIAGNOSIS — Z1211 Encounter for screening for malignant neoplasm of colon: Secondary | ICD-10-CM

## 2022-10-01 DIAGNOSIS — Z79890 Hormone replacement therapy (postmenopausal): Secondary | ICD-10-CM

## 2022-10-01 DIAGNOSIS — E28319 Asymptomatic premature menopause: Secondary | ICD-10-CM

## 2022-10-01 DIAGNOSIS — Z01419 Encounter for gynecological examination (general) (routine) without abnormal findings: Secondary | ICD-10-CM

## 2022-10-01 DIAGNOSIS — N951 Menopausal and female climacteric states: Secondary | ICD-10-CM

## 2022-10-01 DIAGNOSIS — Z1231 Encounter for screening mammogram for malignant neoplasm of breast: Secondary | ICD-10-CM

## 2022-10-01 DIAGNOSIS — Z0000 Encounter for general adult medical examination without abnormal findings: Secondary | ICD-10-CM

## 2022-11-28 ENCOUNTER — Ambulatory Visit
Admission: RE | Admit: 2022-11-28 | Discharge: 2022-11-28 | Disposition: A | Discharge status: Home / Self Care | Payer: Managed Care, Other (non HMO) | Source: Ambulatory Visit | Attending: Obstetrics and Gynecology | Admitting: Obstetrics and Gynecology

## 2022-11-28 DIAGNOSIS — Z1231 Encounter for screening mammogram for malignant neoplasm of breast: Secondary | ICD-10-CM | POA: Insufficient documentation

## 2022-11-29 ENCOUNTER — Ambulatory Visit: Payer: Managed Care, Other (non HMO) | Admitting: Obstetrics and Gynecology

## 2022-11-30 NOTE — Progress Notes (Unsigned)
No chief complaint on file.    HPI:      [name] is a 47 y.o. [phone] who LMP was No LMP recorded. (Menstrual status: Irregular Periods)., presents today for her annual examination. Her menses are absent since 04/2018. No PMB. Pt developed severe vasomotor sx 3/20 as well as fatigue, heat intolerance. She had high normal TSH 3/20 and enlarged thyroid on exam. Had micronodularity on thyroid u/s and positive thyroid ab.  Pt started on levo 25 mcg daily due to enlarged thyoid, hot flashes. Vasomotor sx improved but still sx. Tried activella last yr for 1 mo but too expensive with insurance, so pt stopped it. No sx change after 1 mo. Willing to try it again.   Has enlarged thyroid; RT thyroid was 5x1.8x1.6 cm, LT thyroid was 4.8x1.7x1.5 cm, isthmus was 0.5 cm on 7/20 thyroid u/s. No fruther imaging needed at this time.  Pt is euthyroid with levo 25 mcg dose.  Sex activity: not sex active; s/p tubal ligation. No vag sx. Last Pap: 06/15/20  Results were: no abnormalities /neg HPV DNA Hx of STDs: none   Last mammogram: 11/28/22  Results were normal,  Repeat due in 12 months. There is a FH of breast cancer in her MGM and PGM, genetic testing not indicated. There is no FH of ovarian cancer. The patient does self-breast exams.   Tobacco use: none; stopped cigs 04-01-17 Alcohol use: none No drug use Exercise: moderately active   She does get adequate calcium and Vitamin D in her diet. She has a hx Vit D deficiency.   Hx of borderline lipids with low HDL and borderline LDL, improved 2021-04-01.  Due for labs. Brother died with liver cirrhosis due to alcohol use in past. Pt with slightly elevated ALT/AST last yr, fairly high LFTs 10/20. Will repeat this yr. Offered liver u/s Apr 01, 2021.  Colonoscopy due to wt loss 04-01-17. Repeat due in 5 yrs.     Past Medical History:  Diagnosis Date   Abnormal uterine bleeding    Anxiety    Depression    Dysmenorrhea    GERD (gastroesophageal reflux disease)     Hyperlipidemia    Menorrhagia    Ovarian cyst    Premature menopause 04/01/2018   Vitamin D deficiency     Past Surgical History:  Procedure Laterality Date   COLONOSCOPY  2017-04-01   polyp; repeat in 5 yrs   ESOPHAGOGASTRODUODENOSCOPY     ROTATOR CUFF REPAIR     SHOULDER ARTHROSCOPY     TUBAL LIGATION      Family History  Problem Relation Age of Onset   Breast cancer Maternal Grandmother 62   Diabetes Mother    Hypertension Mother    Diabetes Father    Breast cancer Paternal Grandmother 25   Brain cancer Paternal Grandmother 68    Social History   Socioeconomic History   Marital status: Married    Spouse name: Not on file   Number of children: Not on file   Years of education: Not on file   Highest education level: Not on file  Occupational History   Not on file  Tobacco Use   Smoking status: Some Days   Smokeless tobacco: Never  Vaping Use   Vaping Use: Never used  Substance and Sexual Activity   Alcohol use: No   Drug use: No   Sexual activity: Not Currently    Birth control/protection: Surgical    Comment: Tubal ligation   Other  Topics Concern   Not on file  Social History Narrative   Not on file   Social Determinants of Health   Financial Resource Strain: Not on file  Food Insecurity: Not on file  Transportation Needs: Not on file  Physical Activity: Not on file  Stress: Not on file  Social Connections: Not on file  Intimate Partner Violence: Not on file    Current Outpatient Medications on File Prior to Visit  Medication Sig Dispense Refill   amitriptyline (ELAVIL) 25 MG tablet Take 25 mg by mouth at bedtime.     estradiol (ESTRACE) 0.5 MG tablet Take 1 tablet (0.5 mg total) by mouth daily. 90 tablet 0   levothyroxine (SYNTHROID) 25 MCG tablet Take 1 tablet (25 mcg total) by mouth daily before breakfast. 90 tablet 3   naproxen sodium (ALEVE) 220 MG tablet Take by mouth.     pantoprazole (PROTONIX) 40 MG tablet Take by mouth.     progesterone  (PROMETRIUM) 100 MG capsule Take 1 capsule (100 mg total) by mouth daily. 90 capsule 0   SODIUM FLUORIDE 5000 SENSITIVE 1.1-5 % GEL PLEASE SEE ATTACHED FOR DETAILED DIRECTIONS     No current facility-administered medications on file prior to visit.      ROS:  Review of Systems  Constitutional:  Negative for fatigue, fever and unexpected weight change.  Respiratory:  Negative for cough, shortness of breath and wheezing.   Cardiovascular:  Negative for chest pain, palpitations and leg swelling.  Gastrointestinal:  Negative for blood in stool, constipation, diarrhea, nausea and vomiting.  Endocrine: Negative for cold intolerance, heat intolerance and polyuria.  Genitourinary:  Negative for dyspareunia, dysuria, flank pain, frequency, genital sores, hematuria, menstrual problem, pelvic pain, urgency, vaginal bleeding, vaginal discharge and vaginal pain.  Musculoskeletal:  Negative for arthralgias, back pain, joint swelling and myalgias.  Skin:  Negative for rash.  Neurological:  Negative for dizziness, syncope, light-headedness, numbness and headaches.  Hematological:  Negative for adenopathy.  Psychiatric/Behavioral:  Negative for agitation, confusion, sleep disturbance and suicidal ideas. The patient is not nervous/anxious.      Objective: There were no vitals taken for this visit.   Physical Exam Constitutional:      Appearance: She is well-developed.  Genitourinary:     Vulva normal.     Right Labia: No rash, tenderness or lesions.    Left Labia: No tenderness, lesions or rash.    No vaginal discharge, erythema or tenderness.      Right Adnexa: not tender and no mass present.    Left Adnexa: not tender and no mass present.    No cervical friability or polyp.     Uterus is not enlarged or tender.  Breasts:    Right: No mass, nipple discharge, skin change or tenderness.     Left: No mass, nipple discharge, skin change or tenderness.  Neck:     Thyroid: No thyromegaly.   Cardiovascular:     Rate and Rhythm: Normal rate and regular rhythm.     Heart sounds: Normal heart sounds. No murmur heard. Pulmonary:     Effort: Pulmonary effort is normal.     Breath sounds: Normal breath sounds.  Abdominal:     Palpations: Abdomen is soft.     Tenderness: There is no abdominal tenderness. There is no guarding or rebound.  Musculoskeletal:        General: Normal range of motion.     Cervical back: Normal range of motion.  Lymphadenopathy:  Cervical: No cervical adenopathy.  Neurological:     General: No focal deficit present.     Mental Status: She is alert and oriented to person, place, and time.     Cranial Nerves: No cranial nerve deficit.  Skin:    General: Skin is warm and dry.  Psychiatric:        Mood and Affect: Mood normal.        Behavior: Behavior normal.        Thought Content: Thought content normal.        Judgment: Judgment normal.  Vitals reviewed.     Assessment/Plan: Encounter for annual routine gynecological examination  Encounter for screening mammogram for malignant neoplasm of breast - Plan: MM 3D SCREEN BREAST BILATERAL; pt to sched mammo  Enlarged thyroid - Plan: TSH + free T4; check labs. Will RF levo based on results. No need for u/s f/u currently.   Premature menopause - Plan: estradiol (ESTRACE) 0.5 MG tablet, progesterone (PROMETRIUM) 100 MG capsule; try different HRT due to cost. Discussed benefits of estrogen with premature menopause   Vasomotor symptoms due to menopause - Plan: estradiol (ESTRACE) 0.5 MG tablet, progesterone (PROMETRIUM) 100 MG capsule; Rx eRxd. F/u prn.   Hormone replacement therapy (HRT) - Plan: estradiol (ESTRACE) 0.5 MG tablet, progesterone (PROMETRIUM) 100 MG capsule  Blood tests for routine general physical examination - Plan: Comprehensive metabolic panel  Elevated LDL cholesterol level - Plan: Lipid panel  Screening cholesterol level - Plan: Lipid panel  Elevated LFTs--will f/u with  results. Will check liver u/s if significantly elevated.   No orders of the defined types were placed in this encounter.            GYN counsel breast self exam, mammography screening, menopause, adequate intake of calcium and vitamin D, diet and exercise     F/U  No follow-ups on file.  Remie Mathison B. Arius Harnois, PA-C 11/30/2022 9:55 PM

## 2022-12-03 ENCOUNTER — Encounter: Payer: Self-pay | Admitting: Obstetrics and Gynecology

## 2022-12-03 ENCOUNTER — Ambulatory Visit (INDEPENDENT_AMBULATORY_CARE_PROVIDER_SITE_OTHER): Payer: Managed Care, Other (non HMO) | Admitting: Obstetrics and Gynecology

## 2022-12-03 VITALS — BP 100/60 | Ht 64.0 in | Wt 179.0 lb

## 2022-12-03 DIAGNOSIS — Z1231 Encounter for screening mammogram for malignant neoplasm of breast: Secondary | ICD-10-CM

## 2022-12-03 DIAGNOSIS — Z79890 Hormone replacement therapy (postmenopausal): Secondary | ICD-10-CM

## 2022-12-03 DIAGNOSIS — E7800 Pure hypercholesterolemia, unspecified: Secondary | ICD-10-CM

## 2022-12-03 DIAGNOSIS — Z1211 Encounter for screening for malignant neoplasm of colon: Secondary | ICD-10-CM

## 2022-12-03 DIAGNOSIS — Z01419 Encounter for gynecological examination (general) (routine) without abnormal findings: Secondary | ICD-10-CM | POA: Diagnosis not present

## 2022-12-03 DIAGNOSIS — R7989 Other specified abnormal findings of blood chemistry: Secondary | ICD-10-CM

## 2022-12-03 DIAGNOSIS — N951 Menopausal and female climacteric states: Secondary | ICD-10-CM

## 2022-12-03 DIAGNOSIS — E049 Nontoxic goiter, unspecified: Secondary | ICD-10-CM

## 2022-12-03 DIAGNOSIS — Z0000 Encounter for general adult medical examination without abnormal findings: Secondary | ICD-10-CM

## 2022-12-03 DIAGNOSIS — E28319 Asymptomatic premature menopause: Secondary | ICD-10-CM

## 2022-12-03 MED ORDER — ESTRADIOL 0.5 MG PO TABS: 0.5000 mg | ORAL_TABLET | Freq: Every day | ORAL | 3 refills | Status: DC

## 2022-12-03 MED ORDER — PROGESTERONE MICRONIZED 100 MG PO CAPS: 100.0000 mg | ORAL_CAPSULE | Freq: Every day | ORAL | 3 refills | Status: DC

## 2022-12-03 NOTE — Patient Instructions (Signed)
I value your feedback and you entrusting us with your care. If you get a [facility] patient survey, I would appreciate you taking the time to let us know about your experience today. Thank you! ? ? ?

## 2022-12-20 ENCOUNTER — Encounter: Payer: Self-pay | Admitting: Obstetrics and Gynecology

## 2023-10-22 ENCOUNTER — Telehealth: Payer: Self-pay

## 2023-10-22 DIAGNOSIS — E28319 Asymptomatic premature menopause: Secondary | ICD-10-CM

## 2023-10-22 DIAGNOSIS — Z79890 Hormone replacement therapy (postmenopausal): Secondary | ICD-10-CM

## 2023-10-22 DIAGNOSIS — N951 Menopausal and female climacteric states: Secondary | ICD-10-CM

## 2023-10-22 NOTE — Telephone Encounter (Signed)
Left voicemail to return call. Patient last annual 12/03/22. No future appointments scheduled. Patient needs to schedule annual prior to refills. Also inquire if she will need refills prior to annual. Should have enough as #90 w/3 refills were sent 12/03/22.

## 2023-10-30 MED ORDER — ESTRADIOL 0.5 MG PO TABS: 0.5000 mg | ORAL_TABLET | Freq: Every day | ORAL | 0 refills | Status: DC

## 2023-10-30 NOTE — Telephone Encounter (Signed)
Pharmacy following up on refill. Advised will send one refill. Pharmacy to notify pt to contact our office to schedule annual due in January.

## 2023-10-30 NOTE — Addendum Note (Signed)
Addended by: Kathlene Cote on: 10/30/2023 10:33 AM   Modules accepted: Orders

## 2023-12-12 NOTE — Telephone Encounter (Signed)
Pt has been scheduled for her annual on 01/02/2024 at 1:55 with [facility].

## 2023-12-31 NOTE — Progress Notes (Signed)
 Chief Complaint  Patient presents with   Gynecologic Exam    No concerns     HPI:      Beth Ford is a 48 y.o. [id number] who LMP was Patient's last menstrual period was 05/17/2018 (exact date)., presents today for her annual examination. Her menses are absent since 04/2018. No PMB. Developed VS sx 2020, labs confirmed menopause. Tried activella for 1 mo but too expensive with insurance, so pt stopped it. Changed to estradiol  0.5 mg and prometrium  100 mg with sx relief. Wants to continue.   Has enlarged thyroid ; RT thyroid  was 5x1.8x1.6 cm, LT thyroid  was 4.8x1.7x1.5 cm, isthmus was 0.5 cm on 7/20 thyroid  u/s. No further imaging needed at this time.  Pt is euthyroid with levo 25 mcg dose. Now being followed by PCP.  Sex activity: not sex active; s/p tubal ligation. No vag sx. Last Pap: 06/15/20  Results were: no abnormalities /neg HPV DNA Hx of STDs: none   Last mammogram: 11/28/22  Results were normal,  Repeat due in 12 months. There is a FH of breast cancer in her MGM and PGM, genetic testing not indicated. There is no FH of ovarian cancer. The patient does not do self-breast exams.   Tobacco use: none; stopped cigs 2018 Alcohol use: none No drug use Exercise: min active   She does get adequate calcium and Vitamin D  in her diet. She has a hx Vit D deficiency.   Labs with PCP.  Colonoscopy at [facility] due to wt loss 2018. Repeat due in 5 yrs. Scheduled last yr but got canceled and she never rescheduled. Wants ref today.     Past Medical History:  Diagnosis Date   Abnormal uterine bleeding    Anxiety    Depression    Dysmenorrhea    GERD (gastroesophageal reflux disease)    Hyperlipidemia    Menorrhagia    Ovarian cyst    Premature menopause 2019   Vitamin D  deficiency     Past Surgical History:  Procedure Laterality Date   COLONOSCOPY  2018   polyp; repeat in 5 yrs   ESOPHAGOGASTRODUODENOSCOPY     ROTATOR CUFF REPAIR     SHOULDER ARTHROSCOPY     TUBAL  LIGATION      Family History  Problem Relation Age of Onset   Breast cancer Maternal Grandmother 67   Diabetes Mother    Hypertension Mother    Diabetes Father    Breast cancer Paternal Grandmother 57   Brain cancer Paternal Grandmother 43    Social History   Socioeconomic History   Marital status: Married    Spouse name: Not on file   Number of children: Not on file   Years of education: Not on file   Highest education level: Not on file  Occupational History   Not on file  Tobacco Use   Smoking status: Some Days   Smokeless tobacco: Never  Vaping Use   Vaping status: Never Used  Substance and Sexual Activity   Alcohol use: No   Drug use: No   Sexual activity: Not Currently    Birth control/protection: Surgical    Comment: Tubal ligation   Other Topics Concern   Not on file  Social History Narrative   Not on file   Social Drivers of Health   Financial Resource Strain: Not on file  Food Insecurity: Not on file  Transportation Needs: Not on file  Physical Activity: Not on file  Stress: Not on  file  Social Connections: Not on file  Intimate Partner Violence: Not on file    Current Outpatient Medications on File Prior to Visit  Medication Sig Dispense Refill   amitriptyline (ELAVIL) 25 MG tablet Take 25 mg by mouth at bedtime.     levothyroxine  (SYNTHROID ) 25 MCG tablet Take 1 tablet (25 mcg total) by mouth daily before breakfast. 90 tablet 3   OZEMPIC, 1 MG/DOSE, 4 MG/3ML SOPN      pantoprazole (PROTONIX) 40 MG tablet Take by mouth.     rosuvastatin (CRESTOR) 5 MG tablet Take 5 mg by mouth daily.     No current facility-administered medications on file prior to visit.      ROS:  Review of Systems  Constitutional:  Negative for fatigue, fever and unexpected weight change.  Respiratory:  Negative for cough, shortness of breath and wheezing.   Cardiovascular:  Negative for chest pain, palpitations and leg swelling.  Gastrointestinal:  Negative for blood  in stool, constipation, diarrhea, nausea and vomiting.  Endocrine: Negative for cold intolerance, heat intolerance and polyuria.  Genitourinary:  Negative for dyspareunia, dysuria, flank pain, frequency, genital sores, hematuria, menstrual problem, pelvic pain, urgency, vaginal bleeding, vaginal discharge and vaginal pain.  Musculoskeletal:  Negative for arthralgias, back pain, joint swelling and myalgias.  Skin:  Negative for rash.  Neurological:  Negative for dizziness, syncope, light-headedness, numbness and headaches.  Hematological:  Negative for adenopathy.  Psychiatric/Behavioral:  Negative for agitation, confusion, sleep disturbance and suicidal ideas. The patient is not nervous/anxious.      Objective: BP (!) 88/55   Pulse 98   Ht 5' 4 (1.626 m)   Wt 157 lb (71.2 kg)   LMP 05/17/2018 (Exact Date)   BMI 26.95 kg/m    Physical Exam Constitutional:      Appearance: She is well-developed.  Genitourinary:     Vulva normal.     Right Labia: No rash, tenderness or lesions.    Left Labia: No tenderness, lesions or rash.    No vaginal discharge, erythema or tenderness.      Right Adnexa: not tender and no mass present.    Left Adnexa: not tender and no mass present.    No cervical friability or polyp.     Uterus is not enlarged or tender.  Breasts:    Right: No mass, nipple discharge, skin change or tenderness.     Left: No mass, nipple discharge, skin change or tenderness.  Neck:     Thyroid : No thyromegaly.  Cardiovascular:     Rate and Rhythm: Normal rate and regular rhythm.     Heart sounds: Normal heart sounds. No murmur heard. Pulmonary:     Effort: Pulmonary effort is normal.     Breath sounds: Normal breath sounds.  Abdominal:     Palpations: Abdomen is soft.     Tenderness: There is no abdominal tenderness. There is no guarding or rebound.  Musculoskeletal:        General: Normal range of motion.     Cervical back: Normal range of motion.  Lymphadenopathy:      Cervical: No cervical adenopathy.  Neurological:     General: No focal deficit present.     Mental Status: She is alert and oriented to person, place, and time.     Cranial Nerves: No cranial nerve deficit.  Skin:    General: Skin is warm and dry.  Psychiatric:        Mood and Affect: Mood normal.  Behavior: Behavior normal.        Thought Content: Thought content normal.        Judgment: Judgment normal.  Vitals reviewed.     Assessment/Plan: Encounter for annual routine gynecological examination  Cervical cancer screening - Plan: Cytology - PAP  Screening for HPV (human papillomavirus) - Plan: Cytology - PAP  Encounter for screening mammogram for malignant neoplasm of breast - Plan: MM 3D SCREENING MAMMOGRAM BILATERAL BREAST; pt to schedule mammo  Screening for colon cancer - Plan: Ambulatory referral to Gastroenterology; refer to [facility]  Hormone replacement therapy (HRT) - Plan: estradiol  (ESTRACE ) 0.5 MG tablet, progesterone  (PROMETRIUM ) 100 MG capsule  Vasomotor symptoms due to menopause - Plan: estradiol  (ESTRACE ) 0.5 MG tablet, progesterone  (PROMETRIUM ) 100 MG capsule; doing well, Rx RF eRxd. F/u prn.   Early menopause - Plan: estradiol  (ESTRACE ) 0.5 MG tablet, progesterone  (PROMETRIUM ) 100 MG capsule    Meds ordered this encounter  Medications   estradiol  (ESTRACE ) 0.5 MG tablet    Sig: Take 1 tablet (0.5 mg total) by mouth daily.    Dispense:  90 tablet    Refill:  3    Supervising Provider:   CHERRY, ANIKA [[id number]]   progesterone  (PROMETRIUM ) 100 MG capsule    Sig: Take 1 capsule (100 mg total) by mouth daily.    Dispense:  90 capsule    Refill:  3    Supervising Provider:   CHERRY, ANIKA [[id number]]             GYN counsel breast self exam, mammography screening, menopause, adequate intake of calcium and vitamin D , diet and exercise     F/U  Return in about 1 year (around 01/01/2025).  Joniece Smotherman B. Sila Sarsfield, PA-C 01/02/2024 2:24 PM

## 2024-01-02 ENCOUNTER — Other Ambulatory Visit (HOSPITAL_COMMUNITY)
Admission: RE | Admit: 2024-01-02 | Discharge: 2024-01-02 | Disposition: A | Discharge status: Home / Self Care | Payer: Managed Care, Other (non HMO) | Source: Ambulatory Visit | Attending: Obstetrics and Gynecology | Admitting: Obstetrics and Gynecology

## 2024-01-02 ENCOUNTER — Encounter: Payer: Self-pay | Admitting: Obstetrics and Gynecology

## 2024-01-02 ENCOUNTER — Ambulatory Visit (INDEPENDENT_AMBULATORY_CARE_PROVIDER_SITE_OTHER): Payer: Managed Care, Other (non HMO) | Admitting: Obstetrics and Gynecology

## 2024-01-02 VITALS — BP 88/55 | HR 98 | Ht 64.0 in | Wt 157.0 lb

## 2024-01-02 DIAGNOSIS — Z1151 Encounter for screening for human papillomavirus (HPV): Secondary | ICD-10-CM | POA: Insufficient documentation

## 2024-01-02 DIAGNOSIS — E28319 Asymptomatic premature menopause: Secondary | ICD-10-CM

## 2024-01-02 DIAGNOSIS — Z01419 Encounter for gynecological examination (general) (routine) without abnormal findings: Secondary | ICD-10-CM | POA: Diagnosis not present

## 2024-01-02 DIAGNOSIS — N951 Menopausal and female climacteric states: Secondary | ICD-10-CM

## 2024-01-02 DIAGNOSIS — Z1211 Encounter for screening for malignant neoplasm of colon: Secondary | ICD-10-CM

## 2024-01-02 DIAGNOSIS — Z124 Encounter for screening for malignant neoplasm of cervix: Secondary | ICD-10-CM | POA: Diagnosis present

## 2024-01-02 DIAGNOSIS — Z1231 Encounter for screening mammogram for malignant neoplasm of breast: Secondary | ICD-10-CM

## 2024-01-02 DIAGNOSIS — Z79890 Hormone replacement therapy (postmenopausal): Secondary | ICD-10-CM

## 2024-01-02 MED ORDER — ESTRADIOL 0.5 MG PO TABS: 0.5000 mg | ORAL_TABLET | Freq: Every day | ORAL | 3 refills | Status: AC

## 2024-01-02 MED ORDER — PROGESTERONE MICRONIZED 100 MG PO CAPS: 100.0000 mg | ORAL_CAPSULE | Freq: Every day | ORAL | 3 refills | Status: AC

## 2024-01-02 NOTE — Patient Instructions (Signed)
 I value your feedback and you entrusting [place] with your care. If you get a [facility] patient survey, I would appreciate you taking the time to let us know about your experience today. Thank you!  [facility] ([place])--[phone]

## 2024-01-08 LAB — CYTOLOGY - PAP
Comment: NEGATIVE
Diagnosis: NEGATIVE
High risk HPV: NEGATIVE

## 2024-02-12 ENCOUNTER — Encounter

## 2024-02-26 ENCOUNTER — Ambulatory Visit
Admission: RE | Admit: 2024-02-26 | Discharge: 2024-02-26 | Disposition: A | Discharge status: Home / Self Care | Source: Ambulatory Visit | Attending: Obstetrics and Gynecology | Admitting: Obstetrics and Gynecology

## 2024-02-26 DIAGNOSIS — Z1231 Encounter for screening mammogram for malignant neoplasm of breast: Secondary | ICD-10-CM | POA: Insufficient documentation

## 2024-02-27 ENCOUNTER — Encounter: Payer: Self-pay | Admitting: Obstetrics and Gynecology

## 2025-01-13 ENCOUNTER — Ambulatory Visit: Admitting: Obstetrics and Gynecology
